# Patient Record
Sex: Male | Born: 1950
Health system: Southern US, Community
[De-identification: ages and names within clinical notes are randomized; demographics above are authoritative.]

## PROBLEM LIST (undated history)

## (undated) DIAGNOSIS — F988 Other specified behavioral and emotional disorders with onset usually occurring in childhood and adolescence: Secondary | ICD-10-CM

## (undated) DIAGNOSIS — E785 Hyperlipidemia, unspecified: Secondary | ICD-10-CM

## (undated) DIAGNOSIS — F329 Major depressive disorder, single episode, unspecified: Secondary | ICD-10-CM

## (undated) DIAGNOSIS — M1990 Unspecified osteoarthritis, unspecified site: Secondary | ICD-10-CM

## (undated) DIAGNOSIS — G4730 Sleep apnea, unspecified: Secondary | ICD-10-CM

## (undated) DIAGNOSIS — IMO0002 Reserved for concepts with insufficient information to code with codable children: Secondary | ICD-10-CM

## (undated) DIAGNOSIS — F419 Anxiety disorder, unspecified: Secondary | ICD-10-CM

## (undated) DIAGNOSIS — K219 Gastro-esophageal reflux disease without esophagitis: Secondary | ICD-10-CM

## (undated) DIAGNOSIS — I2119 ST elevation (STEMI) myocardial infarction involving other coronary artery of inferior wall: Secondary | ICD-10-CM

## (undated) DIAGNOSIS — L570 Actinic keratosis: Secondary | ICD-10-CM

## (undated) DIAGNOSIS — F32A Depression, unspecified: Secondary | ICD-10-CM

## (undated) DIAGNOSIS — C4362 Malignant melanoma of left upper limb, including shoulder: Secondary | ICD-10-CM

## (undated) DIAGNOSIS — I2510 Atherosclerotic heart disease of native coronary artery without angina pectoris: Secondary | ICD-10-CM

## (undated) DIAGNOSIS — C4491 Basal cell carcinoma of skin, unspecified: Secondary | ICD-10-CM

## (undated) DIAGNOSIS — I10 Essential (primary) hypertension: Secondary | ICD-10-CM

## (undated) HISTORY — PX: INGUINAL HERNIA REPAIR: SUR1180

## (undated) HISTORY — PX: BACK SURGERY: SHX140

## (undated) HISTORY — PX: LUMBAR DISC SURGERY: SHX700

## (undated) HISTORY — PX: DENTAL SURGERY: SHX609

## (undated) HISTORY — PX: MELANOMA EXCISION: SHX5266

## (undated) HISTORY — PX: NASAL SINUS SURGERY: SHX719

## (undated) HISTORY — PX: MOHS SURGERY: SUR867

## (undated) HISTORY — PX: PROSTATE BIOPSY: SHX241

---

## 1998-10-24 ENCOUNTER — Ambulatory Visit (HOSPITAL_COMMUNITY): Admission: RE | Admit: 1998-10-24 | Discharge: 1998-10-24 | Payer: Self-pay | Admitting: Family Medicine

## 1998-10-24 ENCOUNTER — Encounter: Payer: Self-pay | Admitting: Family Medicine

## 1998-10-27 ENCOUNTER — Encounter: Payer: Self-pay | Admitting: Neurosurgery

## 1998-10-27 ENCOUNTER — Ambulatory Visit (HOSPITAL_COMMUNITY): Admission: RE | Admit: 1998-10-27 | Discharge: 1998-10-27 | Payer: Self-pay | Admitting: Neurosurgery

## 1998-11-03 ENCOUNTER — Ambulatory Visit (HOSPITAL_COMMUNITY): Admission: RE | Admit: 1998-11-03 | Discharge: 1998-11-03 | Payer: Self-pay | Admitting: Family Medicine

## 1998-11-03 ENCOUNTER — Encounter: Payer: Self-pay | Admitting: Family Medicine

## 1999-04-10 ENCOUNTER — Ambulatory Visit (HOSPITAL_COMMUNITY): Admission: RE | Admit: 1999-04-10 | Discharge: 1999-04-10 | Payer: Self-pay | Admitting: Gastroenterology

## 2001-07-27 ENCOUNTER — Encounter: Payer: Self-pay | Admitting: Neurosurgery

## 2001-07-27 ENCOUNTER — Encounter: Admission: RE | Admit: 2001-07-27 | Discharge: 2001-07-27 | Payer: Self-pay | Admitting: Neurosurgery

## 2001-08-10 ENCOUNTER — Encounter: Admission: RE | Admit: 2001-08-10 | Discharge: 2001-08-10 | Payer: Self-pay | Admitting: Neurosurgery

## 2001-08-10 ENCOUNTER — Encounter: Payer: Self-pay | Admitting: Neurosurgery

## 2001-10-27 ENCOUNTER — Encounter: Admission: RE | Admit: 2001-10-27 | Discharge: 2001-10-27 | Payer: Self-pay | Admitting: Neurosurgery

## 2001-10-27 ENCOUNTER — Encounter: Payer: Self-pay | Admitting: Neurosurgery

## 2013-10-05 ENCOUNTER — Encounter: Payer: Self-pay | Admitting: Cardiovascular Disease

## 2013-10-05 ENCOUNTER — Ambulatory Visit (INDEPENDENT_AMBULATORY_CARE_PROVIDER_SITE_OTHER): Payer: BC Managed Care – PPO | Admitting: Cardiovascular Disease

## 2013-10-05 VITALS — BP 150/80 | HR 58 | Ht 71.0 in | Wt 235.0 lb

## 2013-10-05 DIAGNOSIS — R030 Elevated blood-pressure reading, without diagnosis of hypertension: Secondary | ICD-10-CM

## 2013-10-05 DIAGNOSIS — I208 Other forms of angina pectoris: Secondary | ICD-10-CM

## 2013-10-05 DIAGNOSIS — I209 Angina pectoris, unspecified: Secondary | ICD-10-CM

## 2013-10-05 DIAGNOSIS — IMO0001 Reserved for inherently not codable concepts without codable children: Secondary | ICD-10-CM | POA: Insufficient documentation

## 2013-10-05 DIAGNOSIS — R079 Chest pain, unspecified: Secondary | ICD-10-CM

## 2013-10-05 NOTE — Assessment & Plan Note (Signed)
The patient episode of exertional palpitations, left arm and jaw discomfort is suggestive of angina equivalent. No further episodes since then but he has not exerted himself enough. Baseline ECG is slightly abnormal with 0.5 mm of ST depression in the inferior leads. I recommend further risk stratification with a treadmill nuclear stress test. A treadmill stress test alone would not be interpretable due to abnormal baseline. He is to continue aspirin daily and simvastatin. He is mildly bradycardic and thus will avoid adding a beta blocker for now.

## 2013-10-05 NOTE — Assessment & Plan Note (Signed)
No history of hypertension. Continue to monitor and consider treatment based on follow up readings.

## 2013-10-05 NOTE — Patient Instructions (Addendum)
Your physician has requested that you have an exercise stress myoview. For further information please visit [email or web address] Please follow instruction sheet, as given.  Your physician recommends that you schedule a follow-up appointment as needed with Dr Kirke Corin.   Your physician recommends that you continue on your current medications as directed. Please refer to the Current Medication list given to you today.

## 2013-10-05 NOTE — Progress Notes (Signed)
  PCP: Dr. Johny Blamer  HPI  This is a pleasant 62 year old man who was referred by Dr. Tiburcio Pea for evaluation of palpitations, arm and jaw pain. The patient has no previous cardiac history. He reports having a stress test done more than 20 years ago but none recently. He has known history of hyperlipidemia treated with simvastatin as well as chronically elevated PSA. About one month ago when he was mowing the grass, he had an episode of palpitations associated with left arm and jaw discomfort. This was associated with shortness of breath. Lasted for about 15 minutes and resolved with rest. He has not had any further episodes since that time. However, he avoided doing significant physical activities since that episode. He has noticed gradually increasing shortness of breath over the last year. No orthopnea, PND or lower extremity edema. He has no family history of premature coronary artery disease. He quit smoking more than 25 years ago. Denies any alcohol use. He works in [employer].  No Known Allergies   No current outpatient prescriptions on file prior to visit.   No current facility-administered medications on file prior to visit.     No past medical history on file.   No past surgical history on file.   No family history on file.   History   Social History  . Marital Status: Married    Spouse Name: N/A    Number of Children: N/A  . Years of Education: N/A   Occupational History  . Not on file.   Social History Main Topics  . Smoking status: Former Games developer  . Smokeless tobacco: Not on file  . Alcohol Use: No  . Drug Use: No  . Sexual Activity: Not on file   Other Topics Concern  . Not on file   Social History Narrative  . No narrative on file     ROS: A 10 point review of system was performed. It is negative other than what is mentioned in the history of present illness.  PHYSICAL EXAM   BP 150/80  Pulse 58  Ht 5\' 11"  (1.803 m)  Wt 235 lb (106.595 kg)   BMI 32.79 kg/m2 Constitutional: He is oriented to person, place, and time. He appears well-developed and well-nourished. No distress.  HENT: No nasal discharge.  Head: Normocephalic and atraumatic.  Eyes: Pupils are equal and round. Right eye exhibits no discharge. Left eye exhibits no discharge.  Neck: Normal range of motion. Neck supple. No JVD present. No thyromegaly present.  Cardiovascular: Normal rate, regular rhythm, normal heart sounds and. Exam reveals no gallop and no friction rub. No murmur heard.  Pulmonary/Chest: Effort normal and breath sounds normal. No stridor. No respiratory distress. He has no wheezes. He has no rales. He exhibits no tenderness.  Abdominal: Soft. Bowel sounds are normal. He exhibits no distension. There is no tenderness. There is no rebound and no guarding.  Musculoskeletal: Normal range of motion. He exhibits no edema and no tenderness.  Neurological: He is alert and oriented to person, place, and time. Coordination normal.  Skin: Skin is warm and dry. No rash noted. He is not diaphoretic. No erythema. No pallor.  Psychiatric: He has a normal mood and affect. His behavior is normal. Judgment and thought content normal.     EKG: Normal sinus rhythm with 0.5 mm of ST depression in the inferior leads.   ASSESSMENT AND PLAN

## 2013-10-26 ENCOUNTER — Ambulatory Visit (HOSPITAL_COMMUNITY): Payer: BC Managed Care – PPO | Attending: Cardiology | Admitting: Radiology

## 2013-10-26 VITALS — BP 152/81 | HR 61 | Ht 71.0 in | Wt 234.0 lb

## 2013-10-26 DIAGNOSIS — R0989 Other specified symptoms and signs involving the circulatory and respiratory systems: Secondary | ICD-10-CM | POA: Insufficient documentation

## 2013-10-26 DIAGNOSIS — R9431 Abnormal electrocardiogram [ECG] [EKG]: Secondary | ICD-10-CM | POA: Insufficient documentation

## 2013-10-26 DIAGNOSIS — R079 Chest pain, unspecified: Secondary | ICD-10-CM

## 2013-10-26 DIAGNOSIS — R6884 Jaw pain: Secondary | ICD-10-CM | POA: Insufficient documentation

## 2013-10-26 DIAGNOSIS — R0602 Shortness of breath: Secondary | ICD-10-CM

## 2013-10-26 DIAGNOSIS — R002 Palpitations: Secondary | ICD-10-CM | POA: Insufficient documentation

## 2013-10-26 DIAGNOSIS — R0609 Other forms of dyspnea: Secondary | ICD-10-CM | POA: Insufficient documentation

## 2013-10-26 DIAGNOSIS — Z87891 Personal history of nicotine dependence: Secondary | ICD-10-CM | POA: Insufficient documentation

## 2013-10-26 DIAGNOSIS — Z8249 Family history of ischemic heart disease and other diseases of the circulatory system: Secondary | ICD-10-CM | POA: Insufficient documentation

## 2013-10-26 DIAGNOSIS — E785 Hyperlipidemia, unspecified: Secondary | ICD-10-CM | POA: Insufficient documentation

## 2013-10-26 DIAGNOSIS — M79609 Pain in unspecified limb: Secondary | ICD-10-CM | POA: Insufficient documentation

## 2013-10-26 MED ORDER — TECHNETIUM TC 99M SESTAMIBI GENERIC - CARDIOLITE
30.0000 | Freq: Once | INTRAVENOUS | Status: AC | PRN
  Administered 2013-10-26: 30 via INTRAVENOUS

## 2013-10-26 MED ORDER — TECHNETIUM TC 99M SESTAMIBI GENERIC - CARDIOLITE
10.0000 | Freq: Once | INTRAVENOUS | Status: AC | PRN
  Administered 2013-10-26: 10 via INTRAVENOUS

## 2013-10-26 NOTE — Progress Notes (Signed)
[facility] SITE 3 NUCLEAR MED [address] [place] [address] [phone]    Cardiology Nuclear Med Study  Kenneth Henderson is a 62 y.o. male     MRN : [id number]     DOB: 1951-02-23  Procedure Date: 10/26/2013  Nuclear Med Background Indication for Stress Test:  Evaluation for Ischemia and Abnormal EKG History:  GXT 20 years ago Cardiac Risk Factors: Family History - CAD, History of Smoking and Lipids  Symptoms:  DOE, Palpitations and left arm and jaw pain on exertion   Nuclear Pre-Procedure Caffeine/Decaff Intake:  None > 12 hrs NPO After: 7:00pm   Lungs:  clear O2 Sat: 94% on room air. IV 0.9% NS with Angio Cath:  20g  IV Site: R Antecubital x 1, tolerated well IV Started by:  Irean Hong, RN  Chest Size (in):  46 Cup Size: n/a  Height: 5\' 11"  (1.803 m)  Weight:  234 lb (106.142 kg)  BMI:  Body mass index is 32.65 kg/(m^2). Tech Comments:  N/A    Nuclear Med Study 1 or 2 day study: 1 day  Stress Test Type:  Stress  Reading MD: Charlton Haws, MD  Order Authorizing Provider:  Lorine Bears, MD  Resting Radionuclide: Technetium 71m Sestamibi  Resting Radionuclide Dose: 11.0 mCi   Stress Radionuclide:  Technetium 69m Sestamibi  Stress Radionuclide Dose: 33.0 mCi           Stress Protocol Rest HR: 61 Stress HR: 144  Rest BP: 152/81 Stress BP: 218/67  Exercise Time (min): 5:37 METS: 7.0   Predicted Max HR: 156 bpm % Max HR: 92.31 bpm Rate Pressure Product: 21308   Dose of Adenosine (mg):  n/a Dose of Lexiscan: n/a mg  Dose of Atropine (mg): n/a Dose of Dobutamine: n/a mcg/kg/min (at max HR)  Stress Test Technologist: Nelson Chimes, BS-ES  Nuclear Technologist:  Harlow Asa, CNMT     Rest Procedure:  Myocardial perfusion imaging was performed at rest 45 minutes following the intravenous administration of Technetium 24m Sestamibi. Rest ECG: NSR-LVH  Stress Procedure:  The patient exercised on the treadmill utilizing the Bruce Protocol for 5:37  minutes. The patient stopped due to SOB, leg fatigue and denied any chest pain.  Technetium 57m Sestamibi was injected at peak exercise and myocardial perfusion imaging was performed after a brief delay. Just into recovery, patient complained of right sided jaw pain that began to subside approximately 5 minutes into recovery.  Stress ECG: Inferior ST segment depression  QPS Raw Data Images:  Patient motion noted. Stress Images:  Normal homogeneous uptake in all areas of the myocardium. Rest Images:  Normal homogeneous uptake in all areas of the myocardium. Subtraction (SDS):  Normal Transient Ischemic Dilatation (Normal <1.22):  0.99 Lung/Heart Ratio (Normal <0.45):  0.35  Quantitative Gated Spect Images QGS EDV:  132 ml QGS ESV:  63 ml  Impression Exercise Capacity:  Poor exercise capacity. BP Response:  Hypertensive blood pressure response. Clinical Symptoms:  There is dyspnea. ECG Impression:  LVH with inferior ST segment depression Comparison with Prior Nuclear Study: No images to compare  Overall Impression:  Low risk stress nuclear study Normal nuclear images.  Baseline ECG with LVH  Inferior St segment depression with exercise.  LV Ejection Fraction: 52%.  LV Wall Motion:  NL LV Function; NL Wall Motion  Charlton Haws

## 2014-04-01 ENCOUNTER — Ambulatory Visit
Admission: RE | Admit: 2014-04-01 | Discharge: 2014-04-01 | Disposition: A | Discharge status: Home / Self Care | Payer: BC Managed Care – PPO | Source: Ambulatory Visit | Attending: Family Medicine | Admitting: Family Medicine

## 2014-04-01 ENCOUNTER — Other Ambulatory Visit: Payer: Self-pay | Admitting: Family Medicine

## 2014-04-01 DIAGNOSIS — M79609 Pain in unspecified limb: Secondary | ICD-10-CM

## 2014-06-22 DIAGNOSIS — I2119 ST elevation (STEMI) myocardial infarction involving other coronary artery of inferior wall: Secondary | ICD-10-CM

## 2014-06-22 HISTORY — PX: CORONARY ANGIOPLASTY WITH STENT PLACEMENT: SHX49

## 2014-06-22 HISTORY — DX: ST elevation (STEMI) myocardial infarction involving other coronary artery of inferior wall: I21.19

## 2014-08-11 ENCOUNTER — Encounter (HOSPITAL_COMMUNITY): Payer: Self-pay

## 2014-08-11 ENCOUNTER — Ambulatory Visit (HOSPITAL_COMMUNITY)
Admission: RE | Admit: 2014-08-11 | Discharge: 2014-08-11 | Disposition: A | Discharge status: Home / Self Care | Payer: BC Managed Care – PPO | Source: Ambulatory Visit | Attending: Internal Medicine | Admitting: Internal Medicine

## 2014-08-11 VITALS — BP 120/64 | HR 56 | Ht 71.0 in | Wt 237.0 lb

## 2014-08-11 DIAGNOSIS — Z7982 Long term (current) use of aspirin: Secondary | ICD-10-CM | POA: Diagnosis not present

## 2014-08-11 DIAGNOSIS — I209 Angina pectoris, unspecified: Secondary | ICD-10-CM | POA: Diagnosis not present

## 2014-08-11 DIAGNOSIS — I2589 Other forms of chronic ischemic heart disease: Secondary | ICD-10-CM | POA: Insufficient documentation

## 2014-08-11 DIAGNOSIS — Z87891 Personal history of nicotine dependence: Secondary | ICD-10-CM | POA: Diagnosis not present

## 2014-08-11 DIAGNOSIS — E291 Testicular hypofunction: Secondary | ICD-10-CM | POA: Diagnosis not present

## 2014-08-11 DIAGNOSIS — E785 Hyperlipidemia, unspecified: Secondary | ICD-10-CM | POA: Diagnosis not present

## 2014-08-11 DIAGNOSIS — I2510 Atherosclerotic heart disease of native coronary artery without angina pectoris: Secondary | ICD-10-CM | POA: Diagnosis not present

## 2014-08-11 DIAGNOSIS — I208 Other forms of angina pectoris: Secondary | ICD-10-CM

## 2014-08-11 DIAGNOSIS — I255 Ischemic cardiomyopathy: Secondary | ICD-10-CM

## 2014-08-11 DIAGNOSIS — M47812 Spondylosis without myelopathy or radiculopathy, cervical region: Secondary | ICD-10-CM | POA: Diagnosis not present

## 2014-08-11 NOTE — Patient Instructions (Signed)
Labs in 1 month  Your physician has requested that you have an echocardiogram. Echocardiography is a painless test that uses sound waves to create images of your heart. It provides your doctor with information about the size and shape of your heart and how well your heart's chambers and valves are working. This procedure takes approximately one hour. There are no restrictions for this procedure.  You have been referred to Cardiac Rehab  Your physician recommends that you schedule a follow-up appointment in: 3 month

## 2014-08-12 DIAGNOSIS — I255 Ischemic cardiomyopathy: Secondary | ICD-10-CM | POA: Insufficient documentation

## 2014-08-12 DIAGNOSIS — I2510 Atherosclerotic heart disease of native coronary artery without angina pectoris: Secondary | ICD-10-CM | POA: Insufficient documentation

## 2014-08-12 NOTE — Progress Notes (Signed)
Patient ID: Kenneth Henderson, male   DOB: 10/12/1951, 63 y.o.   MRN: [id number] PCP: Dr. Kenton Kingfisher  63 yo with recent inferior MI presents to establish cardiology care.  Patient was at [name] the grass in 7/15.  He developed severe dyspnea and chest pain that did not resolve and called EMS. He was taken to the hospital in [place] and found to be having an inferior MI.  He went to the cath lab and had a DES placed in the RCA.  EF was 40-45% by LV-gram.    Since leaving the hospital, he has been doing well.  He is back at work Artist.  He has no exertional dyspnea or chest pain.  He is tolerating his medications without problems.  No lightheadedness or syncope.    ECG: NSR, old inferior MI  PMH:  1. C-spine arthritis 2. Low testosterone 3. Hyperlipidemia 4. CAD: ETT-Cardiolite 10/14 with below normal exercise capacity but EF 52% and no perfusion defects.  Inferior MI in 7/15 with totally occluded mid RCA, EF 40-45%, inferior akinesis.  Patient had DES to RCA [facility]).    SH: Married, prior smoker, Administrator property  FH: No premature CAD  ROS: All systems reviewed and negative except as per HPI.   Current Outpatient Prescriptions  Medication Sig Dispense Refill  . aspirin 81 MG tablet Take 81 mg by mouth daily.      Marland Kitchen atorvastatin (LIPITOR) 80 MG tablet Take 80 mg by mouth daily.      Hinda Kehr 30 MG/ACT SOLN Take 1 tablet by mouth daily.      Marland Kitchen buPROPion (WELLBUTRIN XL) 300 MG 24 hr tablet Take 1 tablet by mouth daily.      . citalopram (CELEXA) 40 MG tablet Take by mouth daily.       . clopidogrel (PLAVIX) 75 MG tablet Take 75 mg by mouth daily.      Marland Kitchen esomeprazole (NEXIUM) 40 MG capsule Take 40 mg by mouth daily at 12 noon.      Marland Kitchen GLUCOSAMINE-CHONDROITIN PO Take 1 tablet by mouth daily.      Marland Kitchen lisinopril (PRINIVIL,ZESTRIL) 2.5 MG tablet Take 2.5 mg by mouth daily.      . meloxicam (MOBIC) 15 MG tablet Take 15 mg by mouth daily.      .  metoprolol succinate (TOPROL-XL) 50 MG 24 hr tablet Take 50 mg by mouth daily. Take with or immediately following a meal.      . NITROSTAT 0.4 MG SL tablet as needed.      . Omega-3 Fatty Acids (FISH OIL) 1000 MG CAPS Take 1,000 mg by mouth daily.      . tadalafil (CIALIS) 5 MG tablet Take 5 mg by mouth daily as needed for erectile dysfunction.      . simvastatin (ZOCOR) 80 MG tablet Take 80 mg by mouth at bedtime.       No current facility-administered medications for this encounter.    BP 120/64  Pulse 56  Ht 5\' 11"  (1.803 m)  Wt 237 lb (107.502 kg)  BMI 33.07 kg/m2  SpO2 92% General: NAD Neck: No JVD, no thyromegaly or thyroid nodule.  Lungs: Clear to auscultation bilaterally with normal respiratory effort. CV: Nondisplaced PMI.  Heart regular S1/S2, no +S4, no murmur.  No peripheral edema.  No carotid bruit.  Normal pedal pulses.  Abdomen: Soft, nontender, no hepatosplenomegaly, no distention.  Skin: Intact without lesions or rashes.  Neurologic: Alert and  oriented x 3.  Psych: Normal affect. Extremities: No clubbing or cyanosis.  HEENT: Normal.   Assessment/Plan: 1. CAD: s/p inferior MI in 7/15 with DES to RCA.  Patient is doing well currently with no ischemic symptoms.   - Continue ASA 81 and Plavix 75 daily - Continue Toprol XL and lisinopril at current doses. - Continue atorvastatin 80 mg daily.   - I will set him up with cardiac rehab.  2. Hyperlipidemia: Check lipids in 1 month.  3. Ischemic cardiomyopathy: I will get an echocardiogram to reassess EF.  It was 40-45% on LV-gram.   Loralie Champagne 08/12/2014

## 2014-08-25 ENCOUNTER — Encounter (HOSPITAL_COMMUNITY)
Admission: RE | Admit: 2014-08-25 | Discharge: 2014-08-25 | Disposition: A | Discharge status: Home / Self Care | Payer: Medicare Other | Source: Ambulatory Visit | Attending: Internal Medicine | Admitting: Internal Medicine

## 2014-08-25 DIAGNOSIS — I2510 Atherosclerotic heart disease of native coronary artery without angina pectoris: Secondary | ICD-10-CM | POA: Insufficient documentation

## 2014-08-25 DIAGNOSIS — I209 Angina pectoris, unspecified: Secondary | ICD-10-CM | POA: Insufficient documentation

## 2014-08-25 DIAGNOSIS — I2589 Other forms of chronic ischemic heart disease: Secondary | ICD-10-CM | POA: Insufficient documentation

## 2014-08-25 NOTE — Progress Notes (Signed)
Cardiac Rehab Medication Review by a Pharmacist  Does the patient  feel that his/her medications are working for him/her?  yes  Has the patient been experiencing any side effects to the medications prescribed?  Yes. Dizziness  Does the patient measure his/her own blood pressure or blood glucose at home?  no   Does the patient have any problems obtaining medications due to transportation or finances?   no  Understanding of regimen: good Understanding of indications: good Potential of compliance: good  Pharmacist comments: Pt reports compliance with his medication. Asked if he could take all of his daily medications at one time so that it was easier to keep track of them. I told him that would be fine as atorvastatin can be taken in the am with similar effect to nighttime dosing. Pt does not have any other complaints.  Reginia Naas 08/25/2014 9:29 AM

## 2014-08-31 ENCOUNTER — Encounter (HOSPITAL_COMMUNITY)
Admission: RE | Admit: 2014-08-31 | Discharge: 2014-08-31 | Disposition: A | Discharge status: Home / Self Care | Payer: Medicare Other | Source: Ambulatory Visit | Attending: Internal Medicine | Admitting: Internal Medicine

## 2014-08-31 DIAGNOSIS — I2589 Other forms of chronic ischemic heart disease: Secondary | ICD-10-CM | POA: Diagnosis not present

## 2014-08-31 DIAGNOSIS — I2510 Atherosclerotic heart disease of native coronary artery without angina pectoris: Secondary | ICD-10-CM | POA: Diagnosis not present

## 2014-08-31 DIAGNOSIS — I209 Angina pectoris, unspecified: Secondary | ICD-10-CM | POA: Diagnosis not present

## 2014-08-31 NOTE — Progress Notes (Signed)
Pt started cardiac rehab today.  Pt tolerated light exercise without difficulty. Telemetry rhythm Sinus. Vital signs stable. Will continue to monitor the patient throughout  the program.  

## 2014-08-31 NOTE — Progress Notes (Addendum)
Pt reported for first day of exercise.  Pt was visibily upset and angry about heart condition.  Pt was unable to establish any personal goals stating that he was only hear because his wife is making him.  Pt was easily angered when staff would approach to try to perform blood pressure and assess workloads.  Pt stated that he was angry with his healthcare providers and associated cost over his heart condition.  He also expressed that his kids were upset to now have to note a family history of cardiac disease.  Pt had a good exercise session but still left with an angry disposition.  Pt also noted that he did not have the time to come to exercise or to drive here each day.  Other staff members and Surveyor, quantity also noticed pt's upset disposition. Alberteen Sam, MA, ACSM RCEP

## 2014-09-02 ENCOUNTER — Encounter (HOSPITAL_COMMUNITY)
Admission: RE | Admit: 2014-09-02 | Discharge: 2014-09-02 | Disposition: A | Discharge status: Home / Self Care | Payer: Medicare Other | Source: Ambulatory Visit | Attending: Internal Medicine | Admitting: Internal Medicine

## 2014-09-02 DIAGNOSIS — I2510 Atherosclerotic heart disease of native coronary artery without angina pectoris: Secondary | ICD-10-CM | POA: Diagnosis not present

## 2014-09-05 ENCOUNTER — Encounter (HOSPITAL_COMMUNITY)
Admission: RE | Admit: 2014-09-05 | Discharge: 2014-09-05 | Disposition: A | Discharge status: Home / Self Care | Payer: Medicare Other | Source: Ambulatory Visit | Attending: Internal Medicine | Admitting: Internal Medicine

## 2014-09-05 DIAGNOSIS — L678 Other hair color and hair shaft abnormalities: Secondary | ICD-10-CM | POA: Diagnosis not present

## 2014-09-05 DIAGNOSIS — I2510 Atherosclerotic heart disease of native coronary artery without angina pectoris: Secondary | ICD-10-CM | POA: Diagnosis not present

## 2014-09-05 DIAGNOSIS — F411 Generalized anxiety disorder: Secondary | ICD-10-CM | POA: Diagnosis not present

## 2014-09-05 DIAGNOSIS — L738 Other specified follicular disorders: Secondary | ICD-10-CM | POA: Diagnosis not present

## 2014-09-06 ENCOUNTER — Ambulatory Visit (HOSPITAL_BASED_OUTPATIENT_CLINIC_OR_DEPARTMENT_OTHER)
Admission: RE | Admit: 2014-09-06 | Discharge: 2014-09-06 | Disposition: A | Discharge status: Home / Self Care | Payer: Medicare Other | Source: Ambulatory Visit | Attending: Cardiology | Admitting: Cardiology

## 2014-09-06 ENCOUNTER — Ambulatory Visit (HOSPITAL_COMMUNITY)
Admission: RE | Admit: 2014-09-06 | Discharge: 2014-09-06 | Disposition: A | Discharge status: Home / Self Care | Payer: Medicare Other | Source: Ambulatory Visit | Attending: Cardiology | Admitting: Cardiology

## 2014-09-06 DIAGNOSIS — I208 Other forms of angina pectoris: Secondary | ICD-10-CM

## 2014-09-06 DIAGNOSIS — I359 Nonrheumatic aortic valve disorder, unspecified: Secondary | ICD-10-CM

## 2014-09-06 DIAGNOSIS — I219 Acute myocardial infarction, unspecified: Secondary | ICD-10-CM | POA: Insufficient documentation

## 2014-09-06 DIAGNOSIS — I2589 Other forms of chronic ischemic heart disease: Secondary | ICD-10-CM | POA: Insufficient documentation

## 2014-09-06 DIAGNOSIS — I209 Angina pectoris, unspecified: Secondary | ICD-10-CM | POA: Diagnosis not present

## 2014-09-06 DIAGNOSIS — I5022 Chronic systolic (congestive) heart failure: Secondary | ICD-10-CM

## 2014-09-06 DIAGNOSIS — I2510 Atherosclerotic heart disease of native coronary artery without angina pectoris: Secondary | ICD-10-CM

## 2014-09-06 LAB — COMPREHENSIVE METABOLIC PANEL
ALT: 21 U/L (ref 0–53)
AST: 17 U/L (ref 0–37)
Albumin: 4 g/dL (ref 3.5–5.2)
Alkaline Phosphatase: 79 U/L (ref 39–117)
Anion gap: 11 (ref 5–15)
BUN: 20 mg/dL (ref 6–23)
CO2: 26 mEq/L (ref 19–32)
Calcium: 9.8 mg/dL (ref 8.4–10.5)
Chloride: 103 mEq/L (ref 96–112)
Creatinine, Ser: 0.94 mg/dL (ref 0.50–1.35)
GFR calc Af Amer: >90 mL/min (ref >90)
GFR calc non Af Amer: 86 mL/min — ABNORMAL LOW (ref >90)
Glucose, Bld: 98 mg/dL (ref 70–99)
Potassium: 4.2 mEq/L (ref 3.7–5.3)
Sodium: 140 mEq/L (ref 137–147)
Total Bilirubin: 0.5 mg/dL (ref 0.3–1.2)
Total Protein: 7.1 g/dL (ref 6.0–8.3)

## 2014-09-06 LAB — LIPID PANEL
Cholesterol: 128 mg/dL (ref 0–200)
HDL: 31 mg/dL — ABNORMAL LOW (ref >39)
LDL Cholesterol: 78 mg/dL (ref 0–99)
Total CHOL/HDL Ratio: 4.1 RATIO
Triglycerides: 97 mg/dL (ref <150)
VLDL: 19 mg/dL (ref 0–40)

## 2014-09-06 NOTE — Progress Notes (Signed)
Echocardiogram 2D Echocardiogram has been performed.  Kenneth Henderson 09/06/2014, 9:20 AM

## 2014-09-07 ENCOUNTER — Telehealth (HOSPITAL_COMMUNITY): Payer: Self-pay | Admitting: Family Medicine

## 2014-09-07 ENCOUNTER — Encounter (HOSPITAL_COMMUNITY): Payer: Medicare Other

## 2014-09-08 ENCOUNTER — Telehealth (HOSPITAL_COMMUNITY): Payer: Self-pay | Admitting: *Deleted

## 2014-09-09 ENCOUNTER — Encounter (HOSPITAL_COMMUNITY): Payer: Self-pay | Admitting: *Deleted

## 2014-09-09 ENCOUNTER — Telehealth (HOSPITAL_COMMUNITY): Payer: Self-pay | Admitting: *Deleted

## 2014-09-09 ENCOUNTER — Encounter (HOSPITAL_COMMUNITY): Payer: Medicare Other

## 2014-09-09 NOTE — Telephone Encounter (Signed)
Message copied by Scarlette Calico on Fri Sep 09, 2014  1:28 PM ------      Message from: Katrine Coho      Created: Fri Sep 09, 2014  7:12 AM                   ----- Message -----         From: Larey Dresser, MD         Sent: 09/09/2014  12:40 AM           To: Katrine Coho, RN            Normal LV systolic function with mild MR. ------

## 2014-09-09 NOTE — Telephone Encounter (Signed)
Pt aware.

## 2014-09-12 ENCOUNTER — Encounter (HOSPITAL_COMMUNITY): Payer: Medicare Other

## 2014-09-14 ENCOUNTER — Encounter (HOSPITAL_COMMUNITY): Payer: Medicare Other

## 2014-09-16 ENCOUNTER — Encounter (HOSPITAL_COMMUNITY): Payer: Medicare Other

## 2014-09-19 ENCOUNTER — Encounter (HOSPITAL_COMMUNITY): Payer: Medicare Other

## 2014-09-21 ENCOUNTER — Encounter (HOSPITAL_COMMUNITY): Payer: Medicare Other

## 2014-09-23 ENCOUNTER — Encounter (HOSPITAL_COMMUNITY): Payer: BC Managed Care – PPO

## 2014-09-26 ENCOUNTER — Encounter (HOSPITAL_COMMUNITY): Payer: BC Managed Care – PPO

## 2014-09-28 ENCOUNTER — Encounter (HOSPITAL_COMMUNITY): Payer: BC Managed Care – PPO

## 2014-09-30 ENCOUNTER — Encounter (HOSPITAL_COMMUNITY): Payer: BC Managed Care – PPO

## 2014-10-03 ENCOUNTER — Encounter (HOSPITAL_COMMUNITY): Payer: BC Managed Care – PPO

## 2014-10-05 ENCOUNTER — Encounter (HOSPITAL_COMMUNITY): Payer: BC Managed Care – PPO

## 2014-10-07 ENCOUNTER — Encounter (HOSPITAL_COMMUNITY): Payer: BC Managed Care – PPO

## 2014-10-10 ENCOUNTER — Encounter (HOSPITAL_COMMUNITY): Payer: BC Managed Care – PPO

## 2014-10-11 DIAGNOSIS — F39 Unspecified mood [affective] disorder: Secondary | ICD-10-CM | POA: Diagnosis not present

## 2014-10-11 DIAGNOSIS — F900 Attention-deficit hyperactivity disorder, predominantly inattentive type: Secondary | ICD-10-CM | POA: Diagnosis not present

## 2014-10-12 ENCOUNTER — Encounter (HOSPITAL_COMMUNITY): Payer: BC Managed Care – PPO

## 2014-10-14 ENCOUNTER — Encounter (HOSPITAL_COMMUNITY): Payer: BC Managed Care – PPO

## 2014-10-17 ENCOUNTER — Encounter (HOSPITAL_COMMUNITY): Payer: BC Managed Care – PPO

## 2014-10-19 ENCOUNTER — Encounter (HOSPITAL_COMMUNITY): Payer: BC Managed Care – PPO

## 2014-10-21 ENCOUNTER — Encounter (HOSPITAL_COMMUNITY): Payer: BC Managed Care – PPO

## 2014-10-24 ENCOUNTER — Encounter (HOSPITAL_COMMUNITY): Payer: BC Managed Care – PPO

## 2014-10-26 ENCOUNTER — Encounter (HOSPITAL_COMMUNITY): Payer: BC Managed Care – PPO

## 2014-10-28 ENCOUNTER — Encounter (HOSPITAL_COMMUNITY): Payer: BC Managed Care – PPO

## 2014-10-31 ENCOUNTER — Encounter (HOSPITAL_COMMUNITY): Payer: BC Managed Care – PPO

## 2014-11-02 ENCOUNTER — Encounter (HOSPITAL_COMMUNITY): Payer: BC Managed Care – PPO

## 2014-11-04 ENCOUNTER — Encounter (HOSPITAL_COMMUNITY): Payer: BC Managed Care – PPO

## 2014-11-07 ENCOUNTER — Encounter (HOSPITAL_COMMUNITY): Payer: BC Managed Care – PPO

## 2014-11-09 ENCOUNTER — Ambulatory Visit (HOSPITAL_COMMUNITY)
Admission: RE | Admit: 2014-11-09 | Discharge: 2014-11-09 | Disposition: A | Discharge status: Home / Self Care | Payer: Medicare Other | Source: Ambulatory Visit | Attending: Internal Medicine | Admitting: Internal Medicine

## 2014-11-09 ENCOUNTER — Encounter (HOSPITAL_COMMUNITY): Payer: Self-pay

## 2014-11-09 ENCOUNTER — Encounter (HOSPITAL_COMMUNITY): Payer: BC Managed Care – PPO

## 2014-11-09 VITALS — BP 118/64 | HR 51 | Resp 18 | Wt 240.0 lb

## 2014-11-09 DIAGNOSIS — Z7982 Long term (current) use of aspirin: Secondary | ICD-10-CM | POA: Insufficient documentation

## 2014-11-09 DIAGNOSIS — F419 Anxiety disorder, unspecified: Secondary | ICD-10-CM | POA: Diagnosis not present

## 2014-11-09 DIAGNOSIS — F329 Major depressive disorder, single episode, unspecified: Secondary | ICD-10-CM | POA: Diagnosis not present

## 2014-11-09 DIAGNOSIS — Z79899 Other long term (current) drug therapy: Secondary | ICD-10-CM | POA: Insufficient documentation

## 2014-11-09 DIAGNOSIS — I2510 Atherosclerotic heart disease of native coronary artery without angina pectoris: Secondary | ICD-10-CM | POA: Insufficient documentation

## 2014-11-09 DIAGNOSIS — I252 Old myocardial infarction: Secondary | ICD-10-CM | POA: Insufficient documentation

## 2014-11-09 DIAGNOSIS — E785 Hyperlipidemia, unspecified: Secondary | ICD-10-CM | POA: Insufficient documentation

## 2014-11-09 DIAGNOSIS — E780 Pure hypercholesterolemia: Secondary | ICD-10-CM | POA: Diagnosis not present

## 2014-11-09 DIAGNOSIS — I10 Essential (primary) hypertension: Secondary | ICD-10-CM | POA: Diagnosis not present

## 2014-11-09 DIAGNOSIS — Z87891 Personal history of nicotine dependence: Secondary | ICD-10-CM | POA: Diagnosis not present

## 2014-11-09 DIAGNOSIS — E291 Testicular hypofunction: Secondary | ICD-10-CM | POA: Diagnosis not present

## 2014-11-09 DIAGNOSIS — L739 Follicular disorder, unspecified: Secondary | ICD-10-CM | POA: Diagnosis not present

## 2014-11-09 DIAGNOSIS — M79672 Pain in left foot: Secondary | ICD-10-CM | POA: Diagnosis not present

## 2014-11-09 NOTE — Progress Notes (Signed)
Patient ID: Kenneth Henderson, male   DOB: [birth year], 63 y.o.   MRN: [id number] PCP: Dr. Kenton Kingfisher  63 yo with CAD presents for followup.  Patient was at his [place] in [place] the grass in 7/15.  He developed severe dyspnea and chest pain that did not resolve and called EMS. He was taken to the hospital in [place] and found to be having an inferior MI.  He went to the cath lab and had a DES placed in the RCA.  EF was 40-45% by LV-gram.  Echo in 9/15 showed EF back to 60-65% with normal RV.   He has been doing well recently.  No exertional dyspnea or chest pain.  He is very active.  He manages multiple rental properties.  No lightheadedness.  No myalgias.    Labs (9/15): K 4.2, creatinine 0.94, LDL 78, HDL 31  PMH:  1. C-spine arthritis 2. Low testosterone 3. Hyperlipidemia 4. CAD: ETT-Cardiolite 10/14 with below normal exercise capacity but EF 52% and no perfusion defects.  Inferior MI in 7/15 with totally occluded mid RCA, EF 40-45%, inferior akinesis.  Patient had DES to RCA [facility]).  Echo (9/15) with EF 60-65%, grade II diastolic dysfunction, mild MR, normal RV size and systolic function.   SH: Married, prior smoker, Administrator property  FH: No premature CAD  ROS: All systems reviewed and negative except as per HPI.   Current Outpatient Prescriptions  Medication Sig Dispense Refill  . aspirin 81 MG tablet Take 81 mg by mouth daily.    Marland Kitchen atorvastatin (LIPITOR) 80 MG tablet Take 80 mg by mouth daily.    Hinda Kehr 30 MG/ACT SOLN Take 1 tablet by mouth daily.    Marland Kitchen buPROPion (WELLBUTRIN XL) 300 MG 24 hr tablet Take 1 tablet by mouth daily.    . citalopram (CELEXA) 40 MG tablet Take by mouth daily.     . clopidogrel (PLAVIX) 75 MG tablet Take 75 mg by mouth daily.    Marland Kitchen esomeprazole (NEXIUM) 40 MG capsule Take 40 mg by mouth daily at 12 noon.    Marland Kitchen GLUCOSAMINE-CHONDROITIN PO Take 1 tablet by mouth daily.    Marland Kitchen lisinopril (PRINIVIL,ZESTRIL) 2.5 MG tablet Take 2.5 mg by mouth daily.     . meloxicam (MOBIC) 15 MG tablet Take 15 mg by mouth daily.    . metoprolol succinate (TOPROL-XL) 50 MG 24 hr tablet Take 50 mg by mouth daily. Take with or immediately following a meal.    . NITROSTAT 0.4 MG SL tablet as needed.    . Omega-3 Fatty Acids (FISH OIL) 1000 MG CAPS Take 1,000 mg by mouth daily.    . tadalafil (CIALIS) 5 MG tablet Take 5 mg by mouth daily as needed for erectile dysfunction.     No current facility-administered medications for this encounter.    BP 118/64 mmHg  Pulse 51  Resp 18  Wt 240 lb (108.863 kg)  SpO2 96% General: NAD Neck: No JVD, no thyromegaly or thyroid nodule.  Lungs: Clear to auscultation bilaterally with normal respiratory effort. CV: Nondisplaced PMI.  Heart regular S1/S2, no +S4, no murmur.  No peripheral edema.  No carotid bruit.  Normal pedal pulses.  Abdomen: Soft, nontender, no hepatosplenomegaly, no distention.  Skin: Intact without lesions or rashes.  Neurologic: Alert and oriented x 3.  Psych: Normal affect. Extremities: No clubbing or cyanosis.   Assessment/Plan: 1. CAD: s/p inferior MI in 7/15 with DES to RCA.  Patient is doing well currently with  no ischemic symptoms.  Echo in 9/15 showed EF 60-65%.  - Continue ASA 81 and Plavix 75 daily.  He is not a diabetic and can probably safely stop Plavix after a year.  - Continue Toprol XL and lisinopril at current doses. - Continue atorvastatin 80 mg daily.   2. Hyperlipidemia: Good lipids in 9/15.   Loralie Champagne 11/09/2014

## 2014-11-09 NOTE — Patient Instructions (Signed)
We will contact you in 6 months to schedule your next appointment.  

## 2014-11-10 DIAGNOSIS — F900 Attention-deficit hyperactivity disorder, predominantly inattentive type: Secondary | ICD-10-CM | POA: Diagnosis not present

## 2014-11-10 DIAGNOSIS — F39 Unspecified mood [affective] disorder: Secondary | ICD-10-CM | POA: Diagnosis not present

## 2014-11-10 DIAGNOSIS — E785 Hyperlipidemia, unspecified: Secondary | ICD-10-CM | POA: Insufficient documentation

## 2014-11-11 ENCOUNTER — Encounter (HOSPITAL_COMMUNITY): Payer: BC Managed Care – PPO

## 2014-11-14 ENCOUNTER — Encounter (HOSPITAL_COMMUNITY): Payer: BC Managed Care – PPO

## 2014-11-16 ENCOUNTER — Encounter (HOSPITAL_COMMUNITY): Payer: BC Managed Care – PPO

## 2014-11-21 ENCOUNTER — Encounter (HOSPITAL_COMMUNITY): Payer: BC Managed Care – PPO

## 2014-11-23 ENCOUNTER — Encounter (HOSPITAL_COMMUNITY): Payer: BC Managed Care – PPO

## 2014-11-25 ENCOUNTER — Encounter (HOSPITAL_COMMUNITY): Payer: BC Managed Care – PPO

## 2014-11-28 ENCOUNTER — Encounter (HOSPITAL_COMMUNITY): Payer: BC Managed Care – PPO

## 2014-11-30 ENCOUNTER — Encounter (HOSPITAL_COMMUNITY): Payer: BC Managed Care – PPO

## 2014-11-30 ENCOUNTER — Ambulatory Visit (INDEPENDENT_AMBULATORY_CARE_PROVIDER_SITE_OTHER): Payer: Medicare Other | Admitting: Podiatry

## 2014-11-30 ENCOUNTER — Encounter: Payer: Self-pay | Admitting: Podiatry

## 2014-11-30 VITALS — BP 137/77 | HR 52 | Resp 18

## 2014-11-30 DIAGNOSIS — M779 Enthesopathy, unspecified: Secondary | ICD-10-CM

## 2014-11-30 DIAGNOSIS — M205X2 Other deformities of toe(s) (acquired), left foot: Secondary | ICD-10-CM | POA: Diagnosis not present

## 2014-11-30 DIAGNOSIS — I255 Ischemic cardiomyopathy: Secondary | ICD-10-CM

## 2014-11-30 DIAGNOSIS — M7740 Metatarsalgia, unspecified foot: Secondary | ICD-10-CM

## 2014-11-30 DIAGNOSIS — B353 Tinea pedis: Secondary | ICD-10-CM

## 2014-11-30 MED ORDER — NAFTIFINE HCL 2 % EX CREA: 1.0000 "application " | TOPICAL_CREAM | CUTANEOUS | Status: AC

## 2014-11-30 NOTE — Patient Instructions (Signed)

## 2014-11-30 NOTE — Progress Notes (Signed)
   Subjective:    Patient ID: Kenneth Henderson, male    DOB: [birth year], 63 y.o.   MRN: [id number]  HPI  63 year old male presents the operative complaints of left foot pain which has been ongoing for greater than one year. He states he has pain particularly in the ball the foot for which he points the second MTPJ. He states he has pain particularly with weightbearing and prolonged ambulation. He is previously seen his primary care physician where x-rays were obtained which did not reveal any fracture. He denies any history of injury or trauma to the area and he has had no swelling or increase in redness/warmth of the foot. There is no burning or tingling sensations to his foot. There've been no changes since the last x-ray was obtained. No other complaints at this time.    Review of Systems  Musculoskeletal:       Joint pain  All other systems reviewed and are negative.      Objective:   Physical Exam AAO x3, NAD DP/PT pulses palpable bilaterally, CRT less than 3 seconds Protective sensation intact with Simms Weinstein monofilament, vibratory sensation intact, Achilles tendon reflex intact Mild tenderness to palpation overlying the second MTPJ on the left foot. There is no pinpoint bony tenderness or pain with vibratory sensation overlying the digit or the metatarsals.  There is decrease in first MTPJ dorsiflexion, with some discomfort upon range of motion. There is no other areas of pinpoint bony tenderness or pain with vibratory sensation. Mild equinus present bilaterally.There is no overlying edema, erythema, increase in warmth. MMT 5/5, ROM WNL There is dry, scaly, peeling skin on the plantar aspect of bilateral feet with slight erythematous base consistent with tinea pedis (subjectivly, patient states that the areas itch at times). No open lesions. No pain with calf compression, swelling, warmth, erythema.      Assessment & Plan:  63 year old male with likely second MTPJ  capsulitis/metatarsalgia/hallux limitus. -Previous x-rays were reviewed. Patient denied new x-rays at today's appointment as of the no changes at the previous x-rays. -Treatment options were discussed including alternatives, risks, complications. -At this time discussed likely etiology of the patient's pain. Most likely result of decreased first MTPJ range of motion and second MTPJ capsulitis. Currently there is no overlying erythema, edema and there is mild discomfort. Discussed possible steroid injection however patient wishes to walk at this time. Dispensed metatarsal pads. Upon ambulation and issues with the patch he states that the pressure off the area and alleviate his symptoms. Also discussed shoe gear modifications/orthotics to help offload symptomatically areas and support his foot. -Prescribed Naftin. -Follow-up as needed. In the meantime, call the office with any questions, concerns, change in symptoms.

## 2014-12-01 ENCOUNTER — Encounter: Payer: Self-pay | Admitting: Podiatry

## 2014-12-02 ENCOUNTER — Encounter (HOSPITAL_COMMUNITY): Payer: BC Managed Care – PPO

## 2014-12-05 ENCOUNTER — Encounter (HOSPITAL_COMMUNITY): Payer: BC Managed Care – PPO

## 2014-12-07 ENCOUNTER — Encounter (HOSPITAL_COMMUNITY): Payer: BC Managed Care – PPO

## 2014-12-21 DIAGNOSIS — R972 Elevated prostate specific antigen [PSA]: Secondary | ICD-10-CM | POA: Diagnosis not present

## 2014-12-21 DIAGNOSIS — N5201 Erectile dysfunction due to arterial insufficiency: Secondary | ICD-10-CM | POA: Diagnosis not present

## 2014-12-21 DIAGNOSIS — R3912 Poor urinary stream: Secondary | ICD-10-CM | POA: Diagnosis not present

## 2014-12-21 DIAGNOSIS — N401 Enlarged prostate with lower urinary tract symptoms: Secondary | ICD-10-CM | POA: Diagnosis not present

## 2014-12-27 DIAGNOSIS — B372 Candidiasis of skin and nail: Secondary | ICD-10-CM | POA: Diagnosis not present

## 2014-12-27 DIAGNOSIS — Z85828 Personal history of other malignant neoplasm of skin: Secondary | ICD-10-CM | POA: Diagnosis not present

## 2014-12-30 DIAGNOSIS — L570 Actinic keratosis: Secondary | ICD-10-CM | POA: Diagnosis not present

## 2015-01-03 DIAGNOSIS — R1313 Dysphagia, pharyngeal phase: Secondary | ICD-10-CM | POA: Diagnosis not present

## 2015-01-03 DIAGNOSIS — J387 Other diseases of larynx: Secondary | ICD-10-CM | POA: Diagnosis not present

## 2015-01-20 DIAGNOSIS — L570 Actinic keratosis: Secondary | ICD-10-CM | POA: Diagnosis not present

## 2015-01-27 DIAGNOSIS — L570 Actinic keratosis: Secondary | ICD-10-CM | POA: Diagnosis not present

## 2015-02-16 DIAGNOSIS — L814 Other melanin hyperpigmentation: Secondary | ICD-10-CM | POA: Diagnosis not present

## 2015-02-16 DIAGNOSIS — D045 Carcinoma in situ of skin of trunk: Secondary | ICD-10-CM | POA: Diagnosis not present

## 2015-02-16 DIAGNOSIS — D225 Melanocytic nevi of trunk: Secondary | ICD-10-CM | POA: Diagnosis not present

## 2015-02-16 DIAGNOSIS — L859 Epidermal thickening, unspecified: Secondary | ICD-10-CM | POA: Diagnosis not present

## 2015-02-16 DIAGNOSIS — D485 Neoplasm of uncertain behavior of skin: Secondary | ICD-10-CM | POA: Diagnosis not present

## 2015-02-16 DIAGNOSIS — D1801 Hemangioma of skin and subcutaneous tissue: Secondary | ICD-10-CM | POA: Diagnosis not present

## 2015-02-16 DIAGNOSIS — Z85828 Personal history of other malignant neoplasm of skin: Secondary | ICD-10-CM | POA: Diagnosis not present

## 2015-02-16 DIAGNOSIS — D044 Carcinoma in situ of skin of scalp and neck: Secondary | ICD-10-CM | POA: Diagnosis not present

## 2015-02-16 DIAGNOSIS — D2272 Melanocytic nevi of left lower limb, including hip: Secondary | ICD-10-CM | POA: Diagnosis not present

## 2015-02-16 DIAGNOSIS — D2261 Melanocytic nevi of right upper limb, including shoulder: Secondary | ICD-10-CM | POA: Diagnosis not present

## 2015-02-16 DIAGNOSIS — L570 Actinic keratosis: Secondary | ICD-10-CM | POA: Diagnosis not present

## 2015-02-16 DIAGNOSIS — L821 Other seborrheic keratosis: Secondary | ICD-10-CM | POA: Diagnosis not present

## 2015-02-16 DIAGNOSIS — L820 Inflamed seborrheic keratosis: Secondary | ICD-10-CM | POA: Diagnosis not present

## 2015-02-16 DIAGNOSIS — D2271 Melanocytic nevi of right lower limb, including hip: Secondary | ICD-10-CM | POA: Diagnosis not present

## 2015-02-17 DIAGNOSIS — L570 Actinic keratosis: Secondary | ICD-10-CM | POA: Diagnosis not present

## 2015-03-01 ENCOUNTER — Ambulatory Visit: Payer: Medicare Other | Admitting: Podiatry

## 2015-04-04 DIAGNOSIS — F900 Attention-deficit hyperactivity disorder, predominantly inattentive type: Secondary | ICD-10-CM | POA: Diagnosis not present

## 2015-04-04 DIAGNOSIS — F39 Unspecified mood [affective] disorder: Secondary | ICD-10-CM | POA: Diagnosis not present

## 2015-05-10 DIAGNOSIS — I10 Essential (primary) hypertension: Secondary | ICD-10-CM | POA: Diagnosis not present

## 2015-05-10 DIAGNOSIS — F329 Major depressive disorder, single episode, unspecified: Secondary | ICD-10-CM | POA: Diagnosis not present

## 2015-05-10 DIAGNOSIS — E780 Pure hypercholesterolemia: Secondary | ICD-10-CM | POA: Diagnosis not present

## 2015-05-10 DIAGNOSIS — E291 Testicular hypofunction: Secondary | ICD-10-CM | POA: Diagnosis not present

## 2015-07-03 DIAGNOSIS — F900 Attention-deficit hyperactivity disorder, predominantly inattentive type: Secondary | ICD-10-CM | POA: Diagnosis not present

## 2015-07-03 DIAGNOSIS — F39 Unspecified mood [affective] disorder: Secondary | ICD-10-CM | POA: Diagnosis not present

## 2015-07-18 DIAGNOSIS — R1030 Lower abdominal pain, unspecified: Secondary | ICD-10-CM | POA: Diagnosis not present

## 2015-08-17 DIAGNOSIS — D485 Neoplasm of uncertain behavior of skin: Secondary | ICD-10-CM | POA: Diagnosis not present

## 2015-08-17 DIAGNOSIS — C44212 Basal cell carcinoma of skin of right ear and external auricular canal: Secondary | ICD-10-CM | POA: Diagnosis not present

## 2015-08-17 DIAGNOSIS — D225 Melanocytic nevi of trunk: Secondary | ICD-10-CM | POA: Diagnosis not present

## 2015-08-17 DIAGNOSIS — L814 Other melanin hyperpigmentation: Secondary | ICD-10-CM | POA: Diagnosis not present

## 2015-08-17 DIAGNOSIS — R1030 Lower abdominal pain, unspecified: Secondary | ICD-10-CM | POA: Diagnosis not present

## 2015-08-17 DIAGNOSIS — C44222 Squamous cell carcinoma of skin of right ear and external auricular canal: Secondary | ICD-10-CM | POA: Diagnosis not present

## 2015-08-17 DIAGNOSIS — Z85828 Personal history of other malignant neoplasm of skin: Secondary | ICD-10-CM | POA: Diagnosis not present

## 2015-08-17 DIAGNOSIS — L821 Other seborrheic keratosis: Secondary | ICD-10-CM | POA: Diagnosis not present

## 2015-08-17 DIAGNOSIS — L570 Actinic keratosis: Secondary | ICD-10-CM | POA: Diagnosis not present

## 2015-08-17 DIAGNOSIS — C44612 Basal cell carcinoma of skin of right upper limb, including shoulder: Secondary | ICD-10-CM | POA: Diagnosis not present

## 2015-08-17 DIAGNOSIS — C44629 Squamous cell carcinoma of skin of left upper limb, including shoulder: Secondary | ICD-10-CM | POA: Diagnosis not present

## 2015-08-17 DIAGNOSIS — D1801 Hemangioma of skin and subcutaneous tissue: Secondary | ICD-10-CM | POA: Diagnosis not present

## 2015-09-07 DIAGNOSIS — C44222 Squamous cell carcinoma of skin of right ear and external auricular canal: Secondary | ICD-10-CM | POA: Diagnosis not present

## 2015-09-07 DIAGNOSIS — C44212 Basal cell carcinoma of skin of right ear and external auricular canal: Secondary | ICD-10-CM | POA: Diagnosis not present

## 2015-09-07 DIAGNOSIS — Z85828 Personal history of other malignant neoplasm of skin: Secondary | ICD-10-CM | POA: Diagnosis not present

## 2015-09-19 ENCOUNTER — Other Ambulatory Visit: Payer: Self-pay | Admitting: Family Medicine

## 2015-09-19 ENCOUNTER — Ambulatory Visit
Admission: RE | Admit: 2015-09-19 | Discharge: 2015-09-19 | Disposition: A | Discharge status: Home / Self Care | Payer: Medicare Other | Source: Ambulatory Visit | Attending: Family Medicine | Admitting: Family Medicine

## 2015-09-19 DIAGNOSIS — M25552 Pain in left hip: Secondary | ICD-10-CM | POA: Diagnosis not present

## 2015-09-19 DIAGNOSIS — M5032 Other cervical disc degeneration, mid-cervical region: Secondary | ICD-10-CM | POA: Diagnosis not present

## 2015-09-19 DIAGNOSIS — M542 Cervicalgia: Secondary | ICD-10-CM

## 2015-09-19 DIAGNOSIS — R1030 Lower abdominal pain, unspecified: Secondary | ICD-10-CM

## 2015-09-19 DIAGNOSIS — M1611 Unilateral primary osteoarthritis, right hip: Secondary | ICD-10-CM | POA: Diagnosis not present

## 2015-09-20 DIAGNOSIS — D1722 Benign lipomatous neoplasm of skin and subcutaneous tissue of left arm: Secondary | ICD-10-CM | POA: Diagnosis not present

## 2015-09-20 DIAGNOSIS — L570 Actinic keratosis: Secondary | ICD-10-CM | POA: Diagnosis not present

## 2015-09-20 DIAGNOSIS — Z85828 Personal history of other malignant neoplasm of skin: Secondary | ICD-10-CM | POA: Diagnosis not present

## 2015-09-27 DIAGNOSIS — M545 Low back pain: Secondary | ICD-10-CM | POA: Diagnosis not present

## 2015-09-27 DIAGNOSIS — M542 Cervicalgia: Secondary | ICD-10-CM | POA: Diagnosis not present

## 2015-09-27 DIAGNOSIS — G589 Mononeuropathy, unspecified: Secondary | ICD-10-CM | POA: Diagnosis not present

## 2015-09-27 DIAGNOSIS — M5442 Lumbago with sciatica, left side: Secondary | ICD-10-CM | POA: Diagnosis not present

## 2015-09-28 DIAGNOSIS — Z4802 Encounter for removal of sutures: Secondary | ICD-10-CM | POA: Diagnosis not present

## 2015-10-05 DIAGNOSIS — M50322 Other cervical disc degeneration at C5-C6 level: Secondary | ICD-10-CM | POA: Diagnosis not present

## 2015-10-05 DIAGNOSIS — M47812 Spondylosis without myelopathy or radiculopathy, cervical region: Secondary | ICD-10-CM | POA: Diagnosis not present

## 2015-10-05 DIAGNOSIS — M47816 Spondylosis without myelopathy or radiculopathy, lumbar region: Secondary | ICD-10-CM | POA: Diagnosis not present

## 2015-10-05 DIAGNOSIS — M5126 Other intervertebral disc displacement, lumbar region: Secondary | ICD-10-CM | POA: Diagnosis not present

## 2015-10-05 DIAGNOSIS — M5136 Other intervertebral disc degeneration, lumbar region: Secondary | ICD-10-CM | POA: Diagnosis not present

## 2015-10-12 DIAGNOSIS — L570 Actinic keratosis: Secondary | ICD-10-CM | POA: Diagnosis not present

## 2015-10-12 DIAGNOSIS — Z4801 Encounter for change or removal of surgical wound dressing: Secondary | ICD-10-CM | POA: Diagnosis not present

## 2015-10-13 ENCOUNTER — Telehealth (HOSPITAL_COMMUNITY): Payer: Self-pay

## 2015-10-13 NOTE — Telephone Encounter (Signed)
At this point, ok to hold Plavix for 5 days.  > 1 year post-PCI.

## 2015-10-13 NOTE — Telephone Encounter (Signed)
[place] [phone]) and left message telling him that Dr. Aundra Dubin approves for the patient to be off Plavix for 5 days with him greater than one year post PCI

## 2015-10-13 NOTE — Telephone Encounter (Signed)
Kenneth [place] [phone]) for Dr. Melrose Nakayama, would like to know if the patient can be off Plavix for 5-7 days for spinal injections.

## 2015-10-18 DIAGNOSIS — M4806 Spinal stenosis, lumbar region: Secondary | ICD-10-CM | POA: Diagnosis not present

## 2015-10-18 NOTE — Telephone Encounter (Signed)
Dr Rhona Raider would like to know if he can come off his ASA also. You have approved the Plavix.   Approved by Dr Aundra Dubin to hold both the Plavix and ASA for 5-7 days before injection.

## 2015-10-18 NOTE — Telephone Encounter (Signed)
[place] aware.

## 2015-10-26 DIAGNOSIS — M4806 Spinal stenosis, lumbar region: Secondary | ICD-10-CM | POA: Diagnosis not present

## 2015-10-26 DIAGNOSIS — M5126 Other intervertebral disc displacement, lumbar region: Secondary | ICD-10-CM | POA: Diagnosis not present

## 2015-10-26 DIAGNOSIS — M549 Dorsalgia, unspecified: Secondary | ICD-10-CM | POA: Diagnosis not present

## 2015-11-01 DIAGNOSIS — M5126 Other intervertebral disc displacement, lumbar region: Secondary | ICD-10-CM | POA: Diagnosis not present

## 2015-11-07 DIAGNOSIS — I10 Essential (primary) hypertension: Secondary | ICD-10-CM | POA: Diagnosis not present

## 2015-11-07 DIAGNOSIS — Z23 Encounter for immunization: Secondary | ICD-10-CM | POA: Diagnosis not present

## 2015-11-07 DIAGNOSIS — E7800 Pure hypercholesterolemia, unspecified: Secondary | ICD-10-CM | POA: Diagnosis not present

## 2015-11-07 DIAGNOSIS — M4806 Spinal stenosis, lumbar region: Secondary | ICD-10-CM | POA: Diagnosis not present

## 2015-11-07 DIAGNOSIS — F329 Major depressive disorder, single episode, unspecified: Secondary | ICD-10-CM | POA: Diagnosis not present

## 2015-11-29 DIAGNOSIS — M5126 Other intervertebral disc displacement, lumbar region: Secondary | ICD-10-CM | POA: Diagnosis not present

## 2015-12-22 DIAGNOSIS — M4806 Spinal stenosis, lumbar region: Secondary | ICD-10-CM | POA: Diagnosis not present

## 2015-12-22 DIAGNOSIS — M4726 Other spondylosis with radiculopathy, lumbar region: Secondary | ICD-10-CM | POA: Diagnosis not present

## 2015-12-22 DIAGNOSIS — M5431 Sciatica, right side: Secondary | ICD-10-CM | POA: Diagnosis not present

## 2015-12-22 DIAGNOSIS — M5126 Other intervertebral disc displacement, lumbar region: Secondary | ICD-10-CM | POA: Diagnosis not present

## 2015-12-24 HISTORY — PX: LUMBAR LAMINECTOMY: SHX95

## 2015-12-28 DIAGNOSIS — F39 Unspecified mood [affective] disorder: Secondary | ICD-10-CM | POA: Diagnosis not present

## 2015-12-28 DIAGNOSIS — F900 Attention-deficit hyperactivity disorder, predominantly inattentive type: Secondary | ICD-10-CM | POA: Diagnosis not present

## 2016-01-12 DIAGNOSIS — M4806 Spinal stenosis, lumbar region: Secondary | ICD-10-CM | POA: Diagnosis not present

## 2016-01-12 DIAGNOSIS — Z01812 Encounter for preprocedural laboratory examination: Secondary | ICD-10-CM | POA: Diagnosis not present

## 2016-01-12 DIAGNOSIS — Z4689 Encounter for fitting and adjustment of other specified devices: Secondary | ICD-10-CM | POA: Diagnosis not present

## 2016-01-12 DIAGNOSIS — M4726 Other spondylosis with radiculopathy, lumbar region: Secondary | ICD-10-CM | POA: Diagnosis not present

## 2016-01-15 ENCOUNTER — Encounter (HOSPITAL_COMMUNITY): Payer: Self-pay

## 2016-01-15 ENCOUNTER — Ambulatory Visit (HOSPITAL_COMMUNITY)
Admission: RE | Admit: 2016-01-15 | Discharge: 2016-01-15 | Disposition: A | Discharge status: Home / Self Care | Payer: Medicare Other | Source: Ambulatory Visit | Attending: Cardiology | Admitting: Cardiology

## 2016-01-15 VITALS — BP 134/72 | HR 61 | Wt 243.2 lb

## 2016-01-15 DIAGNOSIS — Z7982 Long term (current) use of aspirin: Secondary | ICD-10-CM | POA: Diagnosis not present

## 2016-01-15 DIAGNOSIS — E785 Hyperlipidemia, unspecified: Secondary | ICD-10-CM

## 2016-01-15 DIAGNOSIS — I2510 Atherosclerotic heart disease of native coronary artery without angina pectoris: Secondary | ICD-10-CM | POA: Diagnosis not present

## 2016-01-15 DIAGNOSIS — Z955 Presence of coronary angioplasty implant and graft: Secondary | ICD-10-CM | POA: Diagnosis not present

## 2016-01-15 DIAGNOSIS — Z79899 Other long term (current) drug therapy: Secondary | ICD-10-CM | POA: Insufficient documentation

## 2016-01-15 DIAGNOSIS — I255 Ischemic cardiomyopathy: Secondary | ICD-10-CM | POA: Diagnosis not present

## 2016-01-15 DIAGNOSIS — I252 Old myocardial infarction: Secondary | ICD-10-CM | POA: Insufficient documentation

## 2016-01-15 DIAGNOSIS — M549 Dorsalgia, unspecified: Secondary | ICD-10-CM | POA: Insufficient documentation

## 2016-01-15 DIAGNOSIS — Z87891 Personal history of nicotine dependence: Secondary | ICD-10-CM | POA: Diagnosis not present

## 2016-01-15 DIAGNOSIS — Z7902 Long term (current) use of antithrombotics/antiplatelets: Secondary | ICD-10-CM | POA: Diagnosis not present

## 2016-01-15 NOTE — Addendum Note (Signed)
Encounter addended by: Larey Dresser, MD on: 01/15/2016 10:09 AM<BR>     Documentation filed: Notes Section

## 2016-01-15 NOTE — Progress Notes (Addendum)
Patient ID: Kenneth Henderson, male   DOB: [birth year], 65 y.o.   MRN: [id number]    Advanced Heart Failure Clinic Note   PCP: Dr. Kenton Kingfisher Cardiology: Dr. Aundra Dubin  65 yo with CAD presents for followup.  Patient was at his [facility] in [place] the grass in 7/15.  He developed severe dyspnea and chest pain that did not resolve and called EMS. He was taken to the hospital in [place] and found to be having an inferior MI.  He went to the cath lab and had a DES placed in the RCA.  EF was 40-45% by LV-gram.  Echo in 9/15 showed EF back to 60-65% with normal RV.   He returns today for regular follow up and surgical clearance for laminectomy/discectomy 01/17/16. Has been off Plavix since 01/10/16. Has not had any CP or SOB.  No DOE.  Not exercising intentionally but up and down stairs and ladders frequently with no problems. 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 Remains very active. He manages multiple rental properties.  Denies lightheadedness or dizziness. Denies myalgias.  Primary reason for surgery is radiculopathy and consistent back pain.   Labs (9/15): K 4.2, creatinine 0.94, LDL 78, HDL 31  ECG: NSR, LVH, old inferior MI  PMH:  1. C-spine arthritis 2. Low testosterone 3. Hyperlipidemia 4. CAD: ETT-Cardiolite 10/14 with below normal exercise capacity but EF 52% and no perfusion defects.  Inferior MI in 7/15 with totally occluded mid RCA, EF 40-45%, inferior akinesis.  Patient had DES to RCA [facility]).  Echo (9/15) with EF 60-65%, grade II diastolic dysfunction, mild MR, normal RV size and systolic function.  5. L-spine stenosis.   SH: Married, prior smoker, Administrator property  FH: No premature CAD   ROS: All systems reviewed and negative except as per HPI.   Current Outpatient Prescriptions  Medication Sig Dispense Refill  . lisinopril (PRINIVIL,ZESTRIL) 5 MG tablet Take 5 mg by mouth daily.    Marland Kitchen aspirin 81 MG tablet Take 81 mg by mouth daily.    Marland Kitchen  atorvastatin (LIPITOR) 80 MG tablet Take 80 mg by mouth daily.    Hinda Kehr 30 MG/ACT SOLN Take 1 tablet by mouth daily.    Marland Kitchen buPROPion (WELLBUTRIN XL) 300 MG 24 hr tablet Take 1 tablet by mouth daily.    . citalopram (CELEXA) 40 MG tablet Take by mouth daily.     . clopidogrel (PLAVIX) 75 MG tablet Take 75 mg by mouth daily.    Marland Kitchen doxycycline (VIBRA-TABS) 100 MG tablet   0  . esomeprazole (NEXIUM) 40 MG capsule Take 40 mg by mouth daily at 12 noon.    Marland Kitchen GLUCOSAMINE-CHONDROITIN PO Take 1 tablet by mouth daily.    Marland Kitchen LORazepam (ATIVAN) 0.5 MG tablet   0  . meloxicam (MOBIC) 15 MG tablet Take 15 mg by mouth daily.    . METHYLPHENIDATE 18 MG PO CR tablet Take 18 mg by mouth every morning.  0  . METHYLPHENIDATE 27 MG PO CR tablet Take 27 mg by mouth every morning.  0  . metoprolol succinate (TOPROL-XL) 50 MG 24 hr tablet Take 50 mg by mouth daily. Take with or immediately following a meal.    . Naftifine HCl (NAFTIN) 2 % CREA Apply 1 application topically 1 day or 1 dose. 60 g 2  . NITROSTAT 0.4 MG SL tablet as needed.    . Omega-3 Fatty Acids (FISH OIL) 1000 MG CAPS Take 1,000 mg by mouth  daily.    . tadalafil (CIALIS) 5 MG tablet Take 5 mg by mouth daily as needed for erectile dysfunction.    . triamcinolone cream (KENALOG) 0.1 %   0   No current facility-administered medications for this encounter.    BP 134/72 mmHg  Pulse 61  Wt 243 lb 4 oz (110.337 kg)  SpO2 95%   Wt Readings from Last 3 Encounters:  01/15/16 243 lb 4 oz (110.337 kg)  11/09/14 240 lb (108.863 kg)  08/25/14 237 lb 14 oz (107.9 kg)   General: NAD Neck: No JVD, no thyromegaly or lymphadenopathy noted.  Lungs: CTAB, normal effort. CV: Nondisplaced PMI.  Heart regular S1/S2, no +S4, no murmur.  Trace ankle edema.  No carotid bruit.  Normal pedal pulses.  Abdomen: Soft, NT, ND, no HSM. No bruits or masses. +BS  Skin: Intact without lesions or rashes.  Neurologic: Alert and oriented x 3.  Psych: Normal  affect. Extremities: No clubbing or cyanosis.   Assessment/Plan: 1. CAD: s/p inferior MI in 7/15 with DES to RCA.   - No symptoms.  - Echo 9/15 showed EF 60-65%. - Continue ASA 81. Can stop his Plavix 75 daily.  He is not a diabetic and it has been over a year since his stent placement. Low risk factors.  - Continue Toprol XL 50 mg daily and lisinopril 5 mg daily. - Continue atorvastatin 80 mg daily.   2. Hyperlipidemia:   - Last lipids 9/15 (good). States he had them checked in last few months.  3. Back Pain -  Scheduled for laminectomy/discectomy 01/17/16 - He is cleared for surgery  Will get labwork from PCP (within past few months per pt). He is cleared from surgery and can stop plavix.   Shirley Friar PA-C 01/15/2016   Patient seen with PA, agree with the above note.  He is doing quite well from a cardiac perspective, no dyspnea or chest pain.  His cardiac risk with surgery is certainly not prohibitive though will be mildly above normal give prior MI.  He had a stent placed > 1 year ago.  He may hold Plavix for the surgery.  Make sure to continue beta blocker peri-operatively.  Post-op, he should resume aspirin but does not need to go back on Plavix.   Followup in 1 year.   Loralie Champagne 01/15/2016 10:08 AM

## 2016-01-15 NOTE — Patient Instructions (Signed)
Stop Plavix.   Follow up: 1 year with Dr.McLean

## 2016-01-17 DIAGNOSIS — Z79899 Other long term (current) drug therapy: Secondary | ICD-10-CM | POA: Diagnosis not present

## 2016-01-17 DIAGNOSIS — M5431 Sciatica, right side: Secondary | ICD-10-CM | POA: Diagnosis not present

## 2016-01-17 DIAGNOSIS — M5432 Sciatica, left side: Secondary | ICD-10-CM | POA: Diagnosis not present

## 2016-01-17 DIAGNOSIS — I10 Essential (primary) hypertension: Secondary | ICD-10-CM | POA: Diagnosis not present

## 2016-01-17 DIAGNOSIS — M4726 Other spondylosis with radiculopathy, lumbar region: Secondary | ICD-10-CM | POA: Diagnosis not present

## 2016-01-17 DIAGNOSIS — E785 Hyperlipidemia, unspecified: Secondary | ICD-10-CM | POA: Diagnosis not present

## 2016-01-17 DIAGNOSIS — M4326 Fusion of spine, lumbar region: Secondary | ICD-10-CM | POA: Diagnosis not present

## 2016-01-17 DIAGNOSIS — I2510 Atherosclerotic heart disease of native coronary artery without angina pectoris: Secondary | ICD-10-CM | POA: Diagnosis not present

## 2016-01-17 DIAGNOSIS — I252 Old myocardial infarction: Secondary | ICD-10-CM | POA: Diagnosis not present

## 2016-01-17 DIAGNOSIS — M4806 Spinal stenosis, lumbar region: Secondary | ICD-10-CM | POA: Diagnosis not present

## 2016-01-18 DIAGNOSIS — M5432 Sciatica, left side: Secondary | ICD-10-CM | POA: Diagnosis not present

## 2016-01-18 DIAGNOSIS — E785 Hyperlipidemia, unspecified: Secondary | ICD-10-CM | POA: Diagnosis not present

## 2016-01-18 DIAGNOSIS — M4806 Spinal stenosis, lumbar region: Secondary | ICD-10-CM | POA: Diagnosis not present

## 2016-01-18 DIAGNOSIS — I10 Essential (primary) hypertension: Secondary | ICD-10-CM | POA: Diagnosis not present

## 2016-01-18 DIAGNOSIS — M5431 Sciatica, right side: Secondary | ICD-10-CM | POA: Diagnosis not present

## 2016-01-18 DIAGNOSIS — M4726 Other spondylosis with radiculopathy, lumbar region: Secondary | ICD-10-CM | POA: Diagnosis not present

## 2016-02-16 DIAGNOSIS — M4806 Spinal stenosis, lumbar region: Secondary | ICD-10-CM | POA: Diagnosis not present

## 2016-02-21 DIAGNOSIS — H52203 Unspecified astigmatism, bilateral: Secondary | ICD-10-CM | POA: Diagnosis not present

## 2016-02-21 DIAGNOSIS — H2513 Age-related nuclear cataract, bilateral: Secondary | ICD-10-CM | POA: Diagnosis not present

## 2016-03-07 DIAGNOSIS — L570 Actinic keratosis: Secondary | ICD-10-CM | POA: Diagnosis not present

## 2016-03-07 DIAGNOSIS — D0439 Carcinoma in situ of skin of other parts of face: Secondary | ICD-10-CM | POA: Diagnosis not present

## 2016-03-07 DIAGNOSIS — D1801 Hemangioma of skin and subcutaneous tissue: Secondary | ICD-10-CM | POA: Diagnosis not present

## 2016-03-07 DIAGNOSIS — L738 Other specified follicular disorders: Secondary | ICD-10-CM | POA: Diagnosis not present

## 2016-03-07 DIAGNOSIS — D485 Neoplasm of uncertain behavior of skin: Secondary | ICD-10-CM | POA: Diagnosis not present

## 2016-03-07 DIAGNOSIS — Z85828 Personal history of other malignant neoplasm of skin: Secondary | ICD-10-CM | POA: Diagnosis not present

## 2016-03-07 DIAGNOSIS — D225 Melanocytic nevi of trunk: Secondary | ICD-10-CM | POA: Diagnosis not present

## 2016-03-07 DIAGNOSIS — L821 Other seborrheic keratosis: Secondary | ICD-10-CM | POA: Diagnosis not present

## 2016-03-28 DIAGNOSIS — D0430 Carcinoma in situ of skin of unspecified part of face: Secondary | ICD-10-CM | POA: Diagnosis not present

## 2016-03-28 DIAGNOSIS — C44329 Squamous cell carcinoma of skin of other parts of face: Secondary | ICD-10-CM | POA: Diagnosis not present

## 2016-03-28 DIAGNOSIS — F900 Attention-deficit hyperactivity disorder, predominantly inattentive type: Secondary | ICD-10-CM | POA: Diagnosis not present

## 2016-03-28 DIAGNOSIS — Z85828 Personal history of other malignant neoplasm of skin: Secondary | ICD-10-CM | POA: Diagnosis not present

## 2016-05-15 DIAGNOSIS — L218 Other seborrheic dermatitis: Secondary | ICD-10-CM | POA: Diagnosis not present

## 2016-05-15 DIAGNOSIS — L570 Actinic keratosis: Secondary | ICD-10-CM | POA: Diagnosis not present

## 2016-05-15 DIAGNOSIS — D485 Neoplasm of uncertain behavior of skin: Secondary | ICD-10-CM | POA: Diagnosis not present

## 2016-05-15 DIAGNOSIS — L821 Other seborrheic keratosis: Secondary | ICD-10-CM | POA: Diagnosis not present

## 2016-05-15 DIAGNOSIS — D1801 Hemangioma of skin and subcutaneous tissue: Secondary | ICD-10-CM | POA: Diagnosis not present

## 2016-05-15 DIAGNOSIS — C44319 Basal cell carcinoma of skin of other parts of face: Secondary | ICD-10-CM | POA: Diagnosis not present

## 2016-05-15 DIAGNOSIS — Z85828 Personal history of other malignant neoplasm of skin: Secondary | ICD-10-CM | POA: Diagnosis not present

## 2016-06-21 DIAGNOSIS — M4806 Spinal stenosis, lumbar region: Secondary | ICD-10-CM | POA: Diagnosis not present

## 2016-06-21 DIAGNOSIS — M5432 Sciatica, left side: Secondary | ICD-10-CM | POA: Diagnosis not present

## 2016-06-21 DIAGNOSIS — M5126 Other intervertebral disc displacement, lumbar region: Secondary | ICD-10-CM | POA: Diagnosis not present

## 2016-06-21 DIAGNOSIS — M4726 Other spondylosis with radiculopathy, lumbar region: Secondary | ICD-10-CM | POA: Diagnosis not present

## 2016-07-01 DIAGNOSIS — C44319 Basal cell carcinoma of skin of other parts of face: Secondary | ICD-10-CM | POA: Diagnosis not present

## 2016-07-01 DIAGNOSIS — Z85828 Personal history of other malignant neoplasm of skin: Secondary | ICD-10-CM | POA: Diagnosis not present

## 2016-07-03 DIAGNOSIS — J029 Acute pharyngitis, unspecified: Secondary | ICD-10-CM | POA: Diagnosis not present

## 2016-07-08 DIAGNOSIS — I10 Essential (primary) hypertension: Secondary | ICD-10-CM | POA: Diagnosis not present

## 2016-07-08 DIAGNOSIS — K219 Gastro-esophageal reflux disease without esophagitis: Secondary | ICD-10-CM | POA: Diagnosis not present

## 2016-07-08 DIAGNOSIS — M4806 Spinal stenosis, lumbar region: Secondary | ICD-10-CM | POA: Diagnosis not present

## 2016-07-08 DIAGNOSIS — E7800 Pure hypercholesterolemia, unspecified: Secondary | ICD-10-CM | POA: Diagnosis not present

## 2016-07-08 DIAGNOSIS — N400 Enlarged prostate without lower urinary tract symptoms: Secondary | ICD-10-CM | POA: Diagnosis not present

## 2016-07-08 DIAGNOSIS — Z125 Encounter for screening for malignant neoplasm of prostate: Secondary | ICD-10-CM | POA: Diagnosis not present

## 2016-07-08 DIAGNOSIS — M5126 Other intervertebral disc displacement, lumbar region: Secondary | ICD-10-CM | POA: Diagnosis not present

## 2016-07-10 DIAGNOSIS — F900 Attention-deficit hyperactivity disorder, predominantly inattentive type: Secondary | ICD-10-CM | POA: Diagnosis not present

## 2016-07-17 DIAGNOSIS — M5126 Other intervertebral disc displacement, lumbar region: Secondary | ICD-10-CM | POA: Diagnosis not present

## 2016-07-18 DIAGNOSIS — I10 Essential (primary) hypertension: Secondary | ICD-10-CM | POA: Diagnosis not present

## 2016-07-18 DIAGNOSIS — Z125 Encounter for screening for malignant neoplasm of prostate: Secondary | ICD-10-CM | POA: Diagnosis not present

## 2016-07-18 DIAGNOSIS — Z23 Encounter for immunization: Secondary | ICD-10-CM | POA: Diagnosis not present

## 2016-07-18 DIAGNOSIS — E7800 Pure hypercholesterolemia, unspecified: Secondary | ICD-10-CM | POA: Diagnosis not present

## 2016-07-31 DIAGNOSIS — M5431 Sciatica, right side: Secondary | ICD-10-CM | POA: Diagnosis not present

## 2016-08-19 DIAGNOSIS — D72829 Elevated white blood cell count, unspecified: Secondary | ICD-10-CM | POA: Diagnosis not present

## 2016-09-05 DIAGNOSIS — L814 Other melanin hyperpigmentation: Secondary | ICD-10-CM | POA: Diagnosis not present

## 2016-09-05 DIAGNOSIS — Z85820 Personal history of malignant melanoma of skin: Secondary | ICD-10-CM | POA: Diagnosis not present

## 2016-09-05 DIAGNOSIS — D1801 Hemangioma of skin and subcutaneous tissue: Secondary | ICD-10-CM | POA: Diagnosis not present

## 2016-09-05 DIAGNOSIS — Z85828 Personal history of other malignant neoplasm of skin: Secondary | ICD-10-CM | POA: Diagnosis not present

## 2016-09-05 DIAGNOSIS — L570 Actinic keratosis: Secondary | ICD-10-CM | POA: Diagnosis not present

## 2016-09-05 DIAGNOSIS — D225 Melanocytic nevi of trunk: Secondary | ICD-10-CM | POA: Diagnosis not present

## 2016-09-05 DIAGNOSIS — L821 Other seborrheic keratosis: Secondary | ICD-10-CM | POA: Diagnosis not present

## 2016-09-05 DIAGNOSIS — D485 Neoplasm of uncertain behavior of skin: Secondary | ICD-10-CM | POA: Diagnosis not present

## 2016-10-08 DIAGNOSIS — F39 Unspecified mood [affective] disorder: Secondary | ICD-10-CM | POA: Diagnosis not present

## 2016-10-08 DIAGNOSIS — F900 Attention-deficit hyperactivity disorder, predominantly inattentive type: Secondary | ICD-10-CM | POA: Diagnosis not present

## 2016-10-24 DIAGNOSIS — L570 Actinic keratosis: Secondary | ICD-10-CM | POA: Diagnosis not present

## 2016-10-24 DIAGNOSIS — Z85828 Personal history of other malignant neoplasm of skin: Secondary | ICD-10-CM | POA: Diagnosis not present

## 2016-10-24 DIAGNOSIS — L820 Inflamed seborrheic keratosis: Secondary | ICD-10-CM | POA: Diagnosis not present

## 2016-11-19 DIAGNOSIS — M25511 Pain in right shoulder: Secondary | ICD-10-CM | POA: Diagnosis not present

## 2016-11-19 DIAGNOSIS — M1712 Unilateral primary osteoarthritis, left knee: Secondary | ICD-10-CM | POA: Diagnosis not present

## 2017-02-07 DIAGNOSIS — E7800 Pure hypercholesterolemia, unspecified: Secondary | ICD-10-CM | POA: Diagnosis not present

## 2017-02-07 DIAGNOSIS — I10 Essential (primary) hypertension: Secondary | ICD-10-CM | POA: Diagnosis not present

## 2017-02-07 DIAGNOSIS — K219 Gastro-esophageal reflux disease without esophagitis: Secondary | ICD-10-CM | POA: Diagnosis not present

## 2017-02-07 DIAGNOSIS — F324 Major depressive disorder, single episode, in partial remission: Secondary | ICD-10-CM | POA: Diagnosis not present

## 2017-02-07 DIAGNOSIS — N529 Male erectile dysfunction, unspecified: Secondary | ICD-10-CM | POA: Diagnosis not present

## 2017-02-07 DIAGNOSIS — I2510 Atherosclerotic heart disease of native coronary artery without angina pectoris: Secondary | ICD-10-CM | POA: Diagnosis not present

## 2017-03-27 DIAGNOSIS — Z85828 Personal history of other malignant neoplasm of skin: Secondary | ICD-10-CM | POA: Diagnosis not present

## 2017-03-27 DIAGNOSIS — D225 Melanocytic nevi of trunk: Secondary | ICD-10-CM | POA: Diagnosis not present

## 2017-03-27 DIAGNOSIS — B372 Candidiasis of skin and nail: Secondary | ICD-10-CM | POA: Diagnosis not present

## 2017-03-27 DIAGNOSIS — L821 Other seborrheic keratosis: Secondary | ICD-10-CM | POA: Diagnosis not present

## 2017-03-27 DIAGNOSIS — D1801 Hemangioma of skin and subcutaneous tissue: Secondary | ICD-10-CM | POA: Diagnosis not present

## 2017-03-27 DIAGNOSIS — L570 Actinic keratosis: Secondary | ICD-10-CM | POA: Diagnosis not present

## 2017-04-01 DIAGNOSIS — F900 Attention-deficit hyperactivity disorder, predominantly inattentive type: Secondary | ICD-10-CM | POA: Diagnosis not present

## 2017-04-03 ENCOUNTER — Encounter (HOSPITAL_COMMUNITY): Payer: Self-pay

## 2017-04-03 ENCOUNTER — Observation Stay (HOSPITAL_COMMUNITY)
Admission: EM | Admit: 2017-04-03 | Discharge: 2017-04-05 | Disposition: A | Discharge status: Home / Self Care | Payer: Medicare Other | Attending: Internal Medicine | Admitting: Internal Medicine

## 2017-04-03 ENCOUNTER — Emergency Department (HOSPITAL_COMMUNITY): Payer: Medicare Other

## 2017-04-03 DIAGNOSIS — Z87891 Personal history of nicotine dependence: Secondary | ICD-10-CM | POA: Diagnosis not present

## 2017-04-03 DIAGNOSIS — K219 Gastro-esophageal reflux disease without esophagitis: Secondary | ICD-10-CM | POA: Diagnosis not present

## 2017-04-03 DIAGNOSIS — Z7982 Long term (current) use of aspirin: Secondary | ICD-10-CM | POA: Diagnosis not present

## 2017-04-03 DIAGNOSIS — N400 Enlarged prostate without lower urinary tract symptoms: Secondary | ICD-10-CM | POA: Insufficient documentation

## 2017-04-03 DIAGNOSIS — R1031 Right lower quadrant pain: Secondary | ICD-10-CM | POA: Insufficient documentation

## 2017-04-03 DIAGNOSIS — F329 Major depressive disorder, single episode, unspecified: Secondary | ICD-10-CM | POA: Insufficient documentation

## 2017-04-03 DIAGNOSIS — I340 Nonrheumatic mitral (valve) insufficiency: Secondary | ICD-10-CM | POA: Insufficient documentation

## 2017-04-03 DIAGNOSIS — R0789 Other chest pain: Secondary | ICD-10-CM | POA: Diagnosis not present

## 2017-04-03 DIAGNOSIS — K449 Diaphragmatic hernia without obstruction or gangrene: Secondary | ICD-10-CM | POA: Diagnosis not present

## 2017-04-03 DIAGNOSIS — F988 Other specified behavioral and emotional disorders with onset usually occurring in childhood and adolescence: Secondary | ICD-10-CM | POA: Diagnosis present

## 2017-04-03 DIAGNOSIS — L570 Actinic keratosis: Secondary | ICD-10-CM | POA: Insufficient documentation

## 2017-04-03 DIAGNOSIS — R079 Chest pain, unspecified: Secondary | ICD-10-CM | POA: Diagnosis not present

## 2017-04-03 DIAGNOSIS — I200 Unstable angina: Secondary | ICD-10-CM

## 2017-04-03 DIAGNOSIS — Z955 Presence of coronary angioplasty implant and graft: Secondary | ICD-10-CM | POA: Diagnosis not present

## 2017-04-03 DIAGNOSIS — E7800 Pure hypercholesterolemia, unspecified: Secondary | ICD-10-CM

## 2017-04-03 DIAGNOSIS — I10 Essential (primary) hypertension: Secondary | ICD-10-CM | POA: Diagnosis not present

## 2017-04-03 DIAGNOSIS — F909 Attention-deficit hyperactivity disorder, unspecified type: Secondary | ICD-10-CM | POA: Diagnosis not present

## 2017-04-03 DIAGNOSIS — I2510 Atherosclerotic heart disease of native coronary artery without angina pectoris: Secondary | ICD-10-CM | POA: Diagnosis not present

## 2017-04-03 DIAGNOSIS — I25110 Atherosclerotic heart disease of native coronary artery with unstable angina pectoris: Principal | ICD-10-CM | POA: Insufficient documentation

## 2017-04-03 DIAGNOSIS — I209 Angina pectoris, unspecified: Secondary | ICD-10-CM | POA: Diagnosis not present

## 2017-04-03 DIAGNOSIS — I255 Ischemic cardiomyopathy: Secondary | ICD-10-CM | POA: Diagnosis present

## 2017-04-03 DIAGNOSIS — F419 Anxiety disorder, unspecified: Secondary | ICD-10-CM | POA: Diagnosis present

## 2017-04-03 DIAGNOSIS — E785 Hyperlipidemia, unspecified: Secondary | ICD-10-CM | POA: Diagnosis present

## 2017-04-03 DIAGNOSIS — R1011 Right upper quadrant pain: Secondary | ICD-10-CM

## 2017-04-03 DIAGNOSIS — I252 Old myocardial infarction: Secondary | ICD-10-CM | POA: Diagnosis not present

## 2017-04-03 DIAGNOSIS — I700 Atherosclerosis of aorta: Secondary | ICD-10-CM | POA: Insufficient documentation

## 2017-04-03 DIAGNOSIS — Z8249 Family history of ischemic heart disease and other diseases of the circulatory system: Secondary | ICD-10-CM | POA: Diagnosis not present

## 2017-04-03 DIAGNOSIS — R109 Unspecified abdominal pain: Secondary | ICD-10-CM

## 2017-04-03 HISTORY — DX: Anxiety disorder, unspecified: F41.9

## 2017-04-03 HISTORY — DX: Major depressive disorder, single episode, unspecified: F32.9

## 2017-04-03 HISTORY — DX: Hyperlipidemia, unspecified: E78.5

## 2017-04-03 HISTORY — DX: Atherosclerotic heart disease of native coronary artery without angina pectoris: I25.10

## 2017-04-03 HISTORY — DX: ST elevation (STEMI) myocardial infarction involving other coronary artery of inferior wall: I21.19

## 2017-04-03 HISTORY — DX: Sleep apnea, unspecified: G47.30

## 2017-04-03 HISTORY — DX: Gastro-esophageal reflux disease without esophagitis: K21.9

## 2017-04-03 HISTORY — DX: Reserved for concepts with insufficient information to code with codable children: IMO0002

## 2017-04-03 HISTORY — DX: Malignant melanoma of left upper limb, including shoulder: C43.62

## 2017-04-03 HISTORY — DX: Unspecified osteoarthritis, unspecified site: M19.90

## 2017-04-03 HISTORY — DX: Other specified behavioral and emotional disorders with onset usually occurring in childhood and adolescence: F98.8

## 2017-04-03 HISTORY — DX: Essential (primary) hypertension: I10

## 2017-04-03 HISTORY — DX: Basal cell carcinoma of skin, unspecified: C44.91

## 2017-04-03 HISTORY — DX: Depression, unspecified: F32.A

## 2017-04-03 HISTORY — DX: Actinic keratosis: L57.0

## 2017-04-03 LAB — CBC
HCT: 44.5 % (ref 39.0–52.0)
Hemoglobin: 15.2 g/dL (ref 13.0–17.0)
MCH: 30.6 pg (ref 26.0–34.0)
MCHC: 34.2 g/dL (ref 30.0–36.0)
MCV: 89.5 fL (ref 78.0–100.0)
Platelets: 240 10*3/uL (ref 150–400)
RBC: 4.97 MIL/uL (ref 4.22–5.81)
RDW: 13.1 % (ref 11.5–15.5)
WBC: 10.5 10*3/uL (ref 4.0–10.5)

## 2017-04-03 LAB — HEPATIC FUNCTION PANEL
ALT: 28 U/L (ref 17–63)
AST: 25 U/L (ref 15–41)
Albumin: 4.1 g/dL (ref 3.5–5.0)
Alkaline Phosphatase: 81 U/L (ref 38–126)
Bilirubin, Direct: 0.2 mg/dL (ref 0.1–0.5)
Indirect Bilirubin: 0.5 mg/dL (ref 0.3–0.9)
Total Bilirubin: 0.7 mg/dL (ref 0.3–1.2)
Total Protein: 6.9 g/dL (ref 6.5–8.1)

## 2017-04-03 LAB — BASIC METABOLIC PANEL
Anion gap: 10 (ref 5–15)
BUN: 23 mg/dL — ABNORMAL HIGH (ref 6–20)
CO2: 21 mmol/L — ABNORMAL LOW (ref 22–32)
Calcium: 9.3 mg/dL (ref 8.9–10.3)
Chloride: 106 mmol/L (ref 101–111)
Creatinine, Ser: 0.84 mg/dL (ref 0.61–1.24)
GFR calc Af Amer: >60 mL/min (ref >60)
GFR calc non Af Amer: >60 mL/min (ref >60)
Glucose, Bld: 141 mg/dL — ABNORMAL HIGH (ref 65–99)
Potassium: 4.2 mmol/L (ref 3.5–5.1)
Sodium: 137 mmol/L (ref 135–145)

## 2017-04-03 LAB — I-STAT TROPONIN, ED: Troponin i, poc: 0 ng/mL (ref 0.00–0.08)

## 2017-04-03 LAB — TROPONIN I: Troponin I: <0.03 ng/mL (ref <0.03)

## 2017-04-03 LAB — MAGNESIUM: Magnesium: 1.9 mg/dL (ref 1.7–2.4)

## 2017-04-03 LAB — LIPASE, BLOOD: Lipase: 20 U/L (ref 11–51)

## 2017-04-03 MED ORDER — METHYLPHENIDATE HCL ER (OSM) 36 MG PO TBCR: 36.0000 mg | EXTENDED_RELEASE_TABLET | Freq: Every day | ORAL | Status: DC

## 2017-04-03 MED ORDER — SODIUM CHLORIDE 0.9% FLUSH
3.0000 mL | Freq: Two times a day (BID) | INTRAVENOUS | Status: DC
  Administered 2017-04-03 – 2017-04-04 (×2): 3 mL via INTRAVENOUS

## 2017-04-03 MED ORDER — ACETAMINOPHEN 325 MG PO TABS: 650.0000 mg | ORAL_TABLET | Freq: Four times a day (QID) | ORAL | Status: DC | PRN

## 2017-04-03 MED ORDER — SODIUM CHLORIDE 0.9 % IV SOLN: 250.0000 mL | INTRAVENOUS | Status: DC | PRN

## 2017-04-03 MED ORDER — MORPHINE SULFATE (PF) 4 MG/ML IV SOLN
4.0000 mg | Freq: Once | INTRAVENOUS | Status: AC
  Administered 2017-04-03: 4 mg via INTRAVENOUS
  Filled 2017-04-03: qty 1

## 2017-04-03 MED ORDER — SODIUM CHLORIDE 0.9 % IV BOLUS (SEPSIS)
1000.0000 mL | Freq: Once | INTRAVENOUS | Status: AC
  Administered 2017-04-03: 1000 mL via INTRAVENOUS

## 2017-04-03 MED ORDER — LISINOPRIL 5 MG PO TABS
5.0000 mg | ORAL_TABLET | Freq: Every day | ORAL | Status: DC
  Administered 2017-04-04 – 2017-04-05 (×2): 5 mg via ORAL
  Filled 2017-04-03 (×2): qty 1

## 2017-04-03 MED ORDER — METHYLPHENIDATE HCL ER 20 MG PO TBCR: 40.0000 mg | EXTENDED_RELEASE_TABLET | Freq: Every morning | ORAL | Status: DC

## 2017-04-03 MED ORDER — SODIUM CHLORIDE 0.9% FLUSH: 3.0000 mL | INTRAVENOUS | Status: DC | PRN

## 2017-04-03 MED ORDER — BUPROPION HCL ER (XL) 300 MG PO TB24
300.0000 mg | ORAL_TABLET | Freq: Every day | ORAL | Status: DC
  Administered 2017-04-04 – 2017-04-05 (×2): 300 mg via ORAL
  Filled 2017-04-03 (×2): qty 1

## 2017-04-03 MED ORDER — NITROGLYCERIN 0.4 MG SL SUBL
0.4000 mg | SUBLINGUAL_TABLET | SUBLINGUAL | Status: DC | PRN
Start: 2017-04-03 — End: 2017-04-05

## 2017-04-03 MED ORDER — CITALOPRAM HYDROBROMIDE 20 MG PO TABS
40.0000 mg | ORAL_TABLET | Freq: Every day | ORAL | Status: DC
  Administered 2017-04-04: 40 mg via ORAL
  Filled 2017-04-03: qty 2

## 2017-04-03 MED ORDER — METOPROLOL SUCCINATE ER 50 MG PO TB24
50.0000 mg | ORAL_TABLET | Freq: Every day | ORAL | Status: DC
  Administered 2017-04-04 – 2017-04-05 (×2): 50 mg via ORAL
  Filled 2017-04-03 (×2): qty 1

## 2017-04-03 MED ORDER — ASPIRIN EC 81 MG PO TBEC: 81.0000 mg | DELAYED_RELEASE_TABLET | Freq: Every day | ORAL | Status: DC

## 2017-04-03 MED ORDER — ONDANSETRON HCL 4 MG/2ML IJ SOLN
4.0000 mg | Freq: Once | INTRAMUSCULAR | Status: AC | PRN
  Administered 2017-04-03: 4 mg via INTRAVENOUS
  Filled 2017-04-03: qty 2

## 2017-04-03 MED ORDER — ONDANSETRON HCL 4 MG PO TABS: 4.0000 mg | ORAL_TABLET | Freq: Four times a day (QID) | ORAL | Status: DC | PRN

## 2017-04-03 MED ORDER — FAMOTIDINE IN NACL 20-0.9 MG/50ML-% IV SOLN
20.0000 mg | Freq: Once | INTRAVENOUS | Status: AC
  Administered 2017-04-03: 20 mg via INTRAVENOUS
  Filled 2017-04-03: qty 50

## 2017-04-03 MED ORDER — CHLORHEXIDINE GLUCONATE 0.12 % MT SOLN
15.0000 mL | Freq: Two times a day (BID) | OROMUCOSAL | Status: DC
  Administered 2017-04-03 – 2017-04-05 (×4): 15 mL via OROMUCOSAL
  Filled 2017-04-03 (×4): qty 15

## 2017-04-03 MED ORDER — ALPRAZOLAM 0.5 MG PO TABS: 0.5000 mg | ORAL_TABLET | Freq: Every evening | ORAL | Status: DC | PRN

## 2017-04-03 MED ORDER — ATORVASTATIN CALCIUM 80 MG PO TABS
80.0000 mg | ORAL_TABLET | Freq: Every day | ORAL | Status: DC
  Administered 2017-04-04 – 2017-04-05 (×2): 80 mg via ORAL
  Filled 2017-04-03 (×2): qty 1

## 2017-04-03 MED ORDER — ASPIRIN 81 MG PO CHEW
81.0000 mg | CHEWABLE_TABLET | ORAL | Status: AC
  Administered 2017-04-04: 81 mg via ORAL
  Filled 2017-04-03: qty 1

## 2017-04-03 MED ORDER — ONDANSETRON HCL 4 MG/2ML IJ SOLN
4.0000 mg | Freq: Once | INTRAMUSCULAR | Status: AC
  Administered 2017-04-03: 4 mg via INTRAVENOUS
  Filled 2017-04-03: qty 2

## 2017-04-03 MED ORDER — PANTOPRAZOLE SODIUM 40 MG PO TBEC
40.0000 mg | DELAYED_RELEASE_TABLET | Freq: Every day | ORAL | Status: DC
Start: 2017-04-04 — End: 2017-04-05
  Administered 2017-04-04 – 2017-04-05 (×2): 40 mg via ORAL
  Filled 2017-04-03 (×2): qty 1

## 2017-04-03 MED ORDER — MORPHINE SULFATE (PF) 4 MG/ML IV SOLN
4.0000 mg | INTRAVENOUS | Status: DC | PRN
  Administered 2017-04-04: 4 mg via INTRAVENOUS
  Filled 2017-04-03: qty 1

## 2017-04-03 MED ORDER — HEPARIN (PORCINE) IN NACL 100-0.45 UNIT/ML-% IJ SOLN
1500.0000 [IU]/h | INTRAMUSCULAR | Status: DC
  Administered 2017-04-03: 1350 [IU]/h via INTRAVENOUS
  Administered 2017-04-04: 1500 [IU]/h via INTRAVENOUS
  Filled 2017-04-03 (×2): qty 250

## 2017-04-03 MED ORDER — METHYLPHENIDATE HCL ER 18 MG PO TB24
36.0000 mg | ORAL_TABLET | Freq: Every day | ORAL | Status: DC
  Administered 2017-04-04 – 2017-04-05 (×2): 36 mg via ORAL
  Filled 2017-04-03 (×2): qty 2

## 2017-04-03 MED ORDER — SODIUM CHLORIDE 0.9 % WEIGHT BASED INFUSION
1.0000 mL/kg/h | INTRAVENOUS | Status: DC
  Administered 2017-04-04: 1 mL/kg/h via INTRAVENOUS

## 2017-04-03 MED ORDER — ONDANSETRON HCL 4 MG/2ML IJ SOLN: 4.0000 mg | Freq: Four times a day (QID) | INTRAMUSCULAR | Status: DC | PRN

## 2017-04-03 MED ORDER — IOPAMIDOL (ISOVUE-370) INJECTION 76%
INTRAVENOUS | Status: AC
  Administered 2017-04-03: 100 mL
  Filled 2017-04-03: qty 100

## 2017-04-03 MED ORDER — SODIUM CHLORIDE 0.9 % WEIGHT BASED INFUSION
3.0000 mL/kg/h | INTRAVENOUS | Status: DC
  Administered 2017-04-04: 3 mL/kg/h via INTRAVENOUS

## 2017-04-03 MED ORDER — HEPARIN BOLUS VIA INFUSION
4000.0000 [IU] | Freq: Once | INTRAVENOUS | Status: AC
  Administered 2017-04-03: 4000 [IU] via INTRAVENOUS
  Filled 2017-04-03: qty 4000

## 2017-04-03 MED ORDER — KETOCONAZOLE 2 % EX CREA
TOPICAL_CREAM | Freq: Every day | CUTANEOUS | Status: DC
  Administered 2017-04-05: 09:00:00 via TOPICAL
  Filled 2017-04-03: qty 15

## 2017-04-03 NOTE — ED Provider Notes (Signed)
[facility] DEPT Provider Note   CSN: [id number] Arrival date & time: 04/03/17  1346     History   Chief Complaint Chief Complaint  Patient presents with  . Chest Pain    HPI Kenneth Henderson is a 66 y.o. male.  66 yo M with a cc of epigastric abdominal pain.  Going on for about 4 days.  Denies exertional symptoms. Having some nausea but denies vomiting. Complains of tingling to bilateral arms. This improved with some nitroglycerin but continued to have the abdominal pain. Points to the epigastrium but really feels that it's diffuse. Seems to be somewhat colicky. Denies diarrhea. Denies fevers. He has a history of an MI about 2 years ago that had similar symptoms. Required stent that time.   The history is provided by the patient.  Chest Pain   This is a new problem. The current episode started less than 1 hour ago. The problem occurs constantly. The problem has not changed since onset.The pain is present in the epigastric region. The pain is at a severity of 6/10. The pain is severe. The quality of the pain is described as sharp, heavy and burning. The pain does not radiate. Pertinent negatives include no abdominal pain, no fever, no headaches, no palpitations, no shortness of breath and no vomiting.  His past medical history is significant for MI.  Pertinent negatives for past medical history include no PE.    Past Medical History:  Diagnosis Date  . Coronary artery disease   . Hypertension     Patient Active Problem List   Diagnosis Date Noted  . Hyperlipidemia 11/10/2014  . CAD (coronary artery disease) 08/12/2014  . Cardiomyopathy, ischemic 08/12/2014  . Equivalent angina ([place]) 10/05/2013  . Elevated BP 10/05/2013    Past Surgical History:  Procedure Laterality Date  . BACK SURGERY    . CORONARY ANGIOPLASTY WITH STENT PLACEMENT    . DENTAL SURGERY    . HERNIA REPAIR         Home Medications    Prior to Admission medications   Medication Sig Start Date End  Date Taking? Authorizing Provider  aspirin 81 MG tablet Take 81 mg by mouth daily.    Historical Provider, MD  atorvastatin (LIPITOR) 80 MG tablet Take 80 mg by mouth daily.    Historical Provider, MD  AXIRON 30 MG/ACT SOLN Take 1 tablet by mouth daily. 09/30/13   Historical Provider, MD  buPROPion (WELLBUTRIN XL) 300 MG 24 hr tablet Take 1 tablet by mouth daily. 10/04/13   Historical Provider, MD  citalopram (CELEXA) 40 MG tablet Take by mouth daily.  10/04/13   Historical Provider, MD  doxycycline (VIBRA-TABS) 100 MG tablet  11/09/14   Historical Provider, MD  esomeprazole (NEXIUM) 40 MG capsule Take 40 mg by mouth daily at 12 noon.    Historical Provider, MD  GLUCOSAMINE-CHONDROITIN PO Take 1 tablet by mouth daily.    Historical Provider, MD  lisinopril (PRINIVIL,ZESTRIL) 5 MG tablet Take 5 mg by mouth daily.    Historical Provider, MD  LORazepam (ATIVAN) 0.5 MG tablet  09/26/14   Historical Provider, MD  meloxicam (MOBIC) 15 MG tablet Take 15 mg by mouth daily.    Historical Provider, MD  METHYLPHENIDATE 18 MG PO CR tablet Take 18 mg by mouth every morning. 10/11/14   Historical Provider, MD  METHYLPHENIDATE 27 MG PO CR tablet Take 27 mg by mouth every morning. 11/10/14   Historical Provider, MD  metoprolol succinate (TOPROL-XL) 50 MG 24 hr  tablet Take 50 mg by mouth daily. Take with or immediately following a meal.    Historical Provider, MD  Naftifine HCl (NAFTIN) 2 % CREA Apply 1 application topically 1 day or 1 dose. 11/30/14   Trula Slade, DPM  NITROSTAT 0.4 MG SL tablet as needed. 09/23/13   Historical Provider, MD  Omega-3 Fatty Acids (FISH OIL) 1000 MG CAPS Take 1,000 mg by mouth daily.    Historical Provider, MD  tadalafil (CIALIS) 5 MG tablet Take 5 mg by mouth daily as needed for erectile dysfunction.    Historical Provider, MD  triamcinolone cream (KENALOG) 0.1 %  09/06/14   Historical Provider, MD    Family History No family history on file.  Social History Social History    Substance Use Topics  . Smoking status: Former Research scientist (life sciences)  . Smokeless tobacco: Never Used  . Alcohol use No     Allergies   Patient has no known allergies.   Review of Systems Review of Systems  Constitutional: Negative for chills and fever.  HENT: Negative for congestion and facial swelling.   Eyes: Negative for discharge and visual disturbance.  Respiratory: Negative for shortness of breath.   Cardiovascular: Negative for chest pain and palpitations.  Gastrointestinal: Negative for abdominal pain, diarrhea and vomiting.  Musculoskeletal: Negative for arthralgias and myalgias.  Skin: Negative for color change and rash.  Neurological: Negative for tremors, syncope and headaches.  Psychiatric/Behavioral: Negative for confusion and dysphoric mood.     Physical Exam Updated Vital Signs BP (!) 163/83   Pulse 65   Temp 98 F (36.7 C) (Oral)   Resp (!) 32   Wt 235 lb (106.6 kg)   SpO2 99%   BMI 33.24 kg/m   Physical Exam  Constitutional: He is oriented to person, place, and time. He appears well-developed and well-nourished.  HENT:  Head: Normocephalic and atraumatic.  Eyes: EOM are normal. Pupils are equal, round, and reactive to light.  Neck: Normal range of motion. Neck supple. No JVD present.  Cardiovascular: Normal rate and regular rhythm.  Exam reveals no gallop and no friction rub.   No murmur heard. Pulmonary/Chest: No respiratory distress. He has no wheezes.  Abdominal: He exhibits no distension and no mass. There is tenderness (diffuse). There is no rebound and no guarding.  Musculoskeletal: Normal range of motion.  Neurological: He is alert and oriented to person, place, and time.  Skin: No rash noted. He is diaphoretic. No pallor.  Psychiatric: He has a normal mood and affect. His behavior is normal.  Nursing note and vitals reviewed.    ED Treatments / Results  Labs (all labs ordered are listed, but only abnormal results are displayed) Labs Reviewed   BASIC METABOLIC PANEL - Abnormal; Notable for the following:       Result Value   CO2 21 (*)    Glucose, Bld 141 (*)    BUN 23 (*)    All other components within normal limits  CBC  LIPASE, BLOOD  HEPATIC FUNCTION PANEL  I-STAT TROPOININ, ED    EKG  EKG Interpretation  Date/Time:  Thursday April 03 2017 13:46:37 EDT Ventricular Rate:  53 PR Interval:    QRS Duration: 121 QT Interval:  486 QTC Calculation: 457 R Axis:   50 Text Interpretation:  Sinus rhythm Nonspecific intraventricular conduction delay No significant change since last tracing Confirmed by Janise Gora MD, Quillian Quince ([id number]) on 04/03/2017 2:00:06 PM       Radiology Dg Chest Portable 1  View  Result Date: 04/03/2017 CLINICAL DATA:  Generalized chest pain with bilateral arm numbness as well as abdominal pain. EXAM: PORTABLE CHEST 1 VIEW COMPARISON:  Chest x-ray of August 31st 2009 FINDINGS: The lungs are adequately inflated and clear. The heart and pulmonary vascularity are normal. The mediastinum is normal in width. There is no pleural effusion. There is calcification in the wall of the aortic arch. The observed bony structures are unremarkable. IMPRESSION: There is no acute cardiopulmonary abnormality. Thoracic aortic atherosclerosis. Electronically Signed   By: David  Martinique M.D.   On: 04/03/2017 14:05   Ct Angio Chest/abd/pel For Dissection W And/or Wo Contrast  Result Date: 04/03/2017 CLINICAL DATA:  66 year old male with pain all over and tingling in the arms. EXAM: CT ANGIOGRAPHY CHEST, ABDOMEN AND PELVIS TECHNIQUE: Multidetector CT imaging through the chest, abdomen and pelvis was performed using the standard protocol during bolus administration of intravenous contrast. Multiplanar reconstructed images and MIPs were obtained and reviewed to evaluate the vascular anatomy. Before administration of the IV contrast, a noncontrast chest CT was acquired. CONTRAST:  100 cc Isovue 370 COMPARISON:  Chest x-ray 04/03/2017 FINDINGS:  CTA CHEST FINDINGS Cardiovascular: Heart: No cardiomegaly. No pericardial fluid/thickening. Calcifications of left main, left anterior descending, circumflex, right coronary arteries. Aorta: Diameter of ascending aorta measures 3.2 cm. No dissection. No periaortic fluid. Mild atherosclerotic changes of the aortic arch. Branch vessels are patent. Three vessel arch. Mild atherosclerotic changes of the descending thoracic aorta. No aneurysm. Pulmonary arteries: Timing of the contrast bolus not optimized for evaluation of pulmonary arteries, however, no lobar, segmental, or proximal subsegmental filling defects. Mediastinum/Nodes: Multiple calcified mediastinal lymph nodes, predominantly subcarinal in in the right hilum. Additional lymph nodes throughout all nodal stations which are not enlarged by CT size criteria. No comparison available. Unremarkable course of the esophagus.  Small hiatal hernia. Lungs/Pleura: Calcified granuloma at the right lung base. Atelectasis/scarring at the bilateral dependent lung bases. No confluent airspace disease. No pneumothorax. No pleural effusion. No pneumothorax. Musculoskeletal: No displaced fracture. Degenerative changes of the spine. Review of the MIP images confirms the above findings. CTA ABDOMEN AND PELVIS FINDINGS VASCULAR Aorta: No dissection. No aneurysm. No periaortic fluid. Atherosclerotic changes of the abdominal aorta with mixed calcified and soft plaque. Celiac: No significant atherosclerotic changes at the origin of the celiac artery. Typical branch pattern. SMA: Atherosclerotic changes superior mesenteric artery without significant stenosis. Renals: Bilateral renal arteries with mild atherosclerotic changes and no significant stenosis. IMA: Inferior mesenteric artery is patent. Left colic artery patent. Superior rectal artery patent. Right lower extremity: Mild atherosclerotic changes of the right iliac system without aneurysm, dissection, or occlusion. External iliac  artery patent. Hypogastric artery patent. Proximal right femoral vasculature patent. Left lower extremity: Atherosclerotic changes of the left iliac system without stenosis, occlusion, dissection, or aneurysm. Hypogastric artery is patent. Left external iliac artery patent. Proximal femoral vasculature patent. Veins: Unremarkable appearance of the venous system. Review of the MIP images confirms the above findings. NON-VASCULAR Hepatobiliary: Unremarkable appearance of the liver. Unremarkable gall bladder. Pancreas: Unremarkable appearance of the pancreas. No pericholecystic fluid or inflammatory changes. Unremarkable ductal system. Spleen: Punctate calcifications within splenic parenchyma. Adrenals/Urinary Tract: Unremarkable appearance of adrenal glands. Right: No hydronephrosis. Symmetric perfusion to the left. No nephrolithiasis. Unremarkable course of the right ureter. Left: No hydronephrosis. Symmetric perfusion to the right. No nephrolithiasis. Unremarkable course of the left ureter. Circumferential bladder wall thickening.  No inflammatory changes. Stomach/Bowel: Unremarkable appearance of the stomach. Unremarkable appearance of small bowel.  No evidence of obstruction. Colonic diverticular without evidence of acute diverticulitis. Normal appendix. Lymphatic: Multiple lymph nodes in the para-aortic nodal station, none of which are enlarged. Mesenteric: No free fluid or air. No adenopathy. Reproductive: Transverse diameter of the prostate measures 6.1 cm. Other: No hernia. Musculoskeletal: No acute fracture. Multilevel degenerative changes of the spine. No bony canal narrowing. Vacuum disc phenomenon at L1-L2 and L2-L3. Advanced degenerative disc disease at L5-S1. Mild bilateral degenerative changes of the hips. IMPRESSION: No CT evidence of acute aortic syndrome. No acute finding to account for the patient's symptoms. Enlarged prostate, with circumferential bladder wall thickening, potentially representing  chronic bladder outlet obstruction. Aortic atherosclerosis, as well as left main and 3 vessel coronary artery disease. Evidence of prior granulomatous disease. Small hiatal hernia. Diverticular disease without evidence of acute diverticulitis. Signed, Dulcy Fanny. Earleen Newport, DO [facility] Electronically Signed   By: Corrie Mckusick D.O.   On: 04/03/2017 16:05    Procedures Procedures (including critical care time)  Medications Ordered in ED Medications  ondansetron (ZOFRAN) injection 4 mg (4 mg Intravenous Given 04/03/17 1358)  morphine 4 MG/ML injection 4 mg (4 mg Intravenous Given 04/03/17 1511)  ondansetron (ZOFRAN) injection 4 mg (4 mg Intravenous Given 04/03/17 1511)  sodium chloride 0.9 % bolus 1,000 mL (1,000 mLs Intravenous New Bag/Given 04/03/17 1511)  iopamidol (ISOVUE-370) 76 % injection (100 mLs  Contrast Given 04/03/17 1530)     Initial Impression / Assessment and Plan / ED Course  I have reviewed the triage vital signs and the nursing notes.  Pertinent labs & imaging results that were available during my care of the patient were reviewed by me and considered in my medical decision making (see chart for details).     66 yo M With a chief complaint of abdominal pain. This feels like the patient's prior MI. His EKG is unchanged from prior as well as his troponin is completely negative. 40s of symptoms that expect there to be some elevation of troponin. Her more concerned about the patient's abdominal pain. That with tingling in his hands and him looking generally unwell I'm concerned that he may have a dissection. I will do a dissection protocol. Treat pain and nausea. If negative will discuss with cardiology for similar symptoms and prior MI.  Negative CT scan, of note they did comment on diffuse CAD.  Will discuss with cards.   Patient reassessed and feeling much better.   The patients results and plan were reviewed and  discussed.   Any x-rays performed were independently reviewed by myself.   Differential diagnosis were considered with the presenting HPI.  Medications  ondansetron (ZOFRAN) injection 4 mg (4 mg Intravenous Given 04/03/17 1358)  morphine 4 MG/ML injection 4 mg (4 mg Intravenous Given 04/03/17 1511)  ondansetron (ZOFRAN) injection 4 mg (4 mg Intravenous Given 04/03/17 1511)  sodium chloride 0.9 % bolus 1,000 mL (1,000 mLs Intravenous New Bag/Given 04/03/17 1511)  iopamidol (ISOVUE-370) 76 % injection (100 mLs  Contrast Given 04/03/17 1530)    Vitals:   04/03/17 1350 04/03/17 1415 04/03/17 1445 04/03/17 1515  BP: (!) 175/85 (!) 174/75 (!) 169/87 (!) 163/83  Pulse: (!) 50 (!) 55 (!) 56 65  Resp: 20 (!) 23 (!) 24 (!) 32  Temp: 98 F (36.7 C)     TempSrc: Oral     SpO2: 99% 99% 100% 99%  Weight: 235 lb (106.6 kg)       Final diagnoses:  Chest  pain with moderate risk for cardiac etiology    Admission/ observation were discussed with the admitting physician, patient and/or family and they are comfortable with the plan.    Final Clinical Impressions(s) / ED Diagnoses   Final diagnoses:  Chest pain with moderate risk for cardiac etiology    New Prescriptions New Prescriptions   No medications on file     Deno Etienne, DO 04/03/17 1620

## 2017-04-03 NOTE — ED Notes (Signed)
Cardiology MD at bedside.

## 2017-04-03 NOTE — H&P (Signed)
History and Physical    Kenneth Henderson SWF:[id number] DOB: [birth year] DOA: 04/03/2017  PCP: Shirline Frees, MD   Patient coming from: Home.  I have personally briefly reviewed patient's old medical records in [place]  Chief Complaint: Chest and abdominal pain.  HPI: Kenneth Henderson is a 66 y.o. male with medical history significant of actinic keratosis, anxiety, ADD, hyperlipidemia, hypertension, CAD, history of inferior MI in July 2015 with 100 % occlusion to the RCA, S/P PCI with DES was coming to the emergency department with complaints of abdominal pain for the past 3-4 days, which is associated today with left-sided chest pain radiates to his left arm and tingling of both hands.   Per patient, he had a dental procedure about a week and a half ago. He has been taking 4 tablets of Aleve in the morning and 3 in the evening for pain since then. About 3-4 days ago he started noticing abdominal pain associated with nausea, but no vomiting. Today his symptoms got worse and then he developed chest pain, burning like, radiates to his left arm and associated with tingling of both hands. These symptoms were similar to the the ones he had in 2015 when he was diagnosed with acute MI. He stated that he took sublingual nitroglycerin which relieved the symptoms. He denies fever, chills, productive cough, dyspnea, palpitations, PND, orthopnea or pitting edema of the lower extremities. He denies emesis, diarrhea, constipation, melena or hematochezia. Denies dysuria or frequency.  ED Course: His EKG showed no nonspecific intraventricular conduction delay. Troponin level was negative, CBC and LFTs were normal. His BMP shows a CO2 of 21 mmol/L, glucose of 141 and BUN of 23 mg/dL.   Imaging: CT scan of abdomen/pelvis showed atherosclerosis of aorta, enlarged prostate and circumferential bladder wall thickening, but no acute finding.  The patient was started on a heparin infusion after being evaluated by  cardiology who will perform a cardiac catheterization.   Review of Systems: As per HPI otherwise 10 point review of systems negative.    Past Medical History:  Diagnosis Date  . Actinic keratosis treated with topical fluorouracil (5FU)   . Anxiety   . Attention deficit disorder (ADD) in adult   . Coronary artery disease     CAD with cath 06/2014 in setting of inferior MI showing occluded RCA and underwent PCI with DES to the RCA.  EF at that time was 40-45% and echo 9/15 showed EF 60-65% with normal RV in [place]  . Hyperlipidemia LDL goal <70   . Hypertension     Past Surgical History:  Procedure Laterality Date  . BACK SURGERY    . CORONARY ANGIOPLASTY WITH STENT PLACEMENT    . DENTAL SURGERY    . HERNIA REPAIR       reports that he has quit smoking. He has never used smokeless tobacco. He reports that he does not drink alcohol or use drugs.  No Known Allergies  Family History  Problem Relation Age of Onset  . Heart failure Mother   . Alzheimer's disease Father   . Cancer Sister   . Heart attack Brother 56  . CAD Brother   . Melanoma Brother   . CAD Brother   . Healthy Brother     Prior to Admission medications   Medication Sig Start Date End Date Taking? Authorizing Provider  aspirin 81 MG tablet Take 81 mg by mouth daily.   Yes Historical Provider, MD  atorvastatin (LIPITOR) 80 MG tablet Take 80  mg by mouth daily.   Yes Historical Provider, MD  azithromycin (ZITHROMAX) 500 MG tablet Take 500 mg by mouth daily. 04/02/17  Yes Historical Provider, MD  buPROPion (WELLBUTRIN XL) 300 MG 24 hr tablet Take 300 mg by mouth daily.  10/04/13  Yes Historical Provider, MD  chlorhexidine (PERIDEX) 0.12 % solution RINSE MOUTH WITH 15ML (1 CAPFUL) FOR 30 SECONDS am AND PM AFTER TOOTHBRUSHING. EXPECTORATE AFTER RINSING, DO NOT SWALLOW 03/14/17  Yes Historical Provider, MD  citalopram (CELEXA) 40 MG tablet Take 40 mg by mouth daily.  10/04/13  Yes Historical Provider, MD  esomeprazole  (NEXIUM) 40 MG capsule Take 40 mg by mouth daily at 12 noon.   Yes Historical Provider, MD  fluorouracil (EFUDEX) 5 % cream APPLY TO AFFECTED AREA OF SKIN 2 TIMES DAILY AS DIRECTED FOR 2 WEEKS 03/27/17  Yes Historical Provider, MD  GLUCOSAMINE-CHONDROITIN PO Take 1 tablet by mouth daily.   Yes Historical Provider, MD  ketoconazole (NIZORAL) 2 % cream APPLY TO AFFECTED AREA 2 TIMES DAILY FOR 2 WEEKS 03/27/17  Yes Historical Provider, MD  lisinopril (PRINIVIL,ZESTRIL) 5 MG tablet Take 5 mg by mouth daily.   Yes Historical Provider, MD  methylphenidate (METADATE CD) 40 MG CR capsule Take 40 mg by mouth every morning. 03/10/17  Yes Historical Provider, MD  metoprolol succinate (TOPROL-XL) 50 MG 24 hr tablet Take 50 mg by mouth daily. Take with or immediately following a meal.   Yes Historical Provider, MD  NITROSTAT 0.4 MG SL tablet Place 0.4 mg under the tongue every 5 (five) minutes as needed for chest pain.  09/23/13  Yes Historical Provider, MD  Omega-3 Fatty Acids (FISH OIL) 1000 MG CAPS Take 1,000 mg by mouth daily.   Yes Historical Provider, MD  PREVIDENT 5000 SENSITIVE 1.1-5 % PSTE BRUSH ON teeth 2 TIMES DAILY FOR 2 minutes. spit out as much possible. DO not SWISH, eat, OR drink FOR 30 minutes 01/01/17  Yes Historical Provider, MD  Naftifine HCl (NAFTIN) 2 % CREA Apply 1 application topically 1 day or 1 dose. Patient not taking: Reported on 04/03/2017 11/30/14   Trula Slade, DPM    Physical Exam:  Constitutional: NAD, calm, comfortable Vitals:   04/03/17 1800 04/03/17 1830 04/03/17 1915 04/03/17 1930  BP: 136/82 138/79 (!) 149/88 136/74  Pulse: 72 73 70 65  Resp: 15 17 16 13   Temp:      TempSrc:      SpO2: 97% 97% 96% 95%  Weight:      Height: 5' 10.47" (1.79 m)      Eyes: PERRL, lids and conjunctivae normal ENMT: Mucous membranes are moist. Posterior pharynx clear of any exudate or lesions. Neck: normal, supple, no masses, no thyromegaly Respiratory: clear to auscultation  bilaterally, no wheezing, no crackles. Normal respiratory effort. No accessory muscle use.  Cardiovascular: Regular rate and rhythm, no murmurs / rubs / gallops. No extremity edema. 2+ pedal pulses. No carotid bruits.  Abdomen: Soft, mild diffuse tenderness, no guarding/rebound/masses palpated. No hepatosplenomegaly. Bowel sounds positive.  Musculoskeletal: no clubbing / cyanosis. Good ROM, no contractures. Normal muscle tone.  Skin: no rashes, lesions, ulcers. No induration Neurologic: CN 2-12 grossly intact. Sensation intact, DTR normal. Strength 5/5 in all 4.  Psychiatric: Normal judgment and insight. Alert and oriented x 3. Normal mood.    Labs on Admission: I have personally reviewed following labs and imaging studies  CBC:  Recent Labs Lab 04/03/17 1355  WBC 10.5  HGB 15.2  HCT 44.5  MCV 89.5  PLT 323   Basic Metabolic Panel:  Recent Labs Lab 04/03/17 1355  NA 137  K 4.2  CL 106  CO2 21*  GLUCOSE 141*  BUN 23*  CREATININE 0.84  CALCIUM 9.3   GFR: Estimated Creatinine Clearance: 105.1 mL/min (by C-G formula based on SCr of 0.84 mg/dL). Liver Function Tests:  Recent Labs Lab 04/03/17 1355  AST 25  ALT 28  ALKPHOS 81  BILITOT 0.7  PROT 6.9  ALBUMIN 4.1    Recent Labs Lab 04/03/17 1355  LIPASE 20   No results for input(s): AMMONIA in the last 168 hours. Coagulation Profile: No results for input(s): INR, PROTIME in the last 168 hours. Cardiac Enzymes:  Recent Labs Lab 04/03/17 1815  TROPONINI <0.03   BNP (last 3 results) No results for input(s): PROBNP in the last 8760 hours. HbA1C: No results for input(s): HGBA1C in the last 72 hours. CBG: No results for input(s): GLUCAP in the last 168 hours. Lipid Profile: No results for input(s): CHOL, HDL, LDLCALC, TRIG, CHOLHDL, LDLDIRECT in the last 72 hours. Thyroid Function Tests: No results for input(s): TSH, T4TOTAL, FREET4, T3FREE, THYROIDAB in the last 72 hours. Anemia Panel: No results for  input(s): VITAMINB12, FOLATE, FERRITIN, TIBC, IRON, RETICCTPCT in the last 72 hours. Urine analysis: No results found for: COLORURINE, APPEARANCEUR, LABSPEC, [place], GLUCOSEU, HGBUR, BILIRUBINUR, KETONESUR, PROTEINUR, UROBILINOGEN, NITRITE, LEUKOCYTESUR  Radiological Exams on Admission: Dg Chest Portable 1 View  Result Date: 04/03/2017 CLINICAL DATA:  Generalized chest pain with bilateral arm numbness as well as abdominal pain. EXAM: PORTABLE CHEST 1 VIEW COMPARISON:  Chest x-ray of August 31st 2009 FINDINGS: The lungs are adequately inflated and clear. The heart and pulmonary vascularity are normal. The mediastinum is normal in width. There is no pleural effusion. There is calcification in the wall of the aortic arch. The observed bony structures are unremarkable. IMPRESSION: There is no acute cardiopulmonary abnormality. Thoracic aortic atherosclerosis. Electronically Signed   By: Sophiarose Eades  [place] M.D.   On: 04/03/2017 14:05   Ct Angio Chest/abd/pel For Dissection W And/or Wo Contrast  Result Date: 04/03/2017 CLINICAL DATA:  66 year old male with pain all over and tingling in the arms. EXAM: CT ANGIOGRAPHY CHEST, ABDOMEN AND PELVIS TECHNIQUE: Multidetector CT imaging through the chest, abdomen and pelvis was performed using the standard protocol during bolus administration of intravenous contrast. Multiplanar reconstructed images and MIPs were obtained and reviewed to evaluate the vascular anatomy. Before administration of the IV contrast, a noncontrast chest CT was acquired. CONTRAST:  100 cc Isovue 370 COMPARISON:  Chest x-ray 04/03/2017 FINDINGS: CTA CHEST FINDINGS Cardiovascular: Heart: No cardiomegaly. No pericardial fluid/thickening. Calcifications of left main, left anterior descending, circumflex, right coronary arteries. Aorta: Diameter of ascending aorta measures 3.2 cm. No dissection. No periaortic fluid. Mild atherosclerotic changes of the aortic arch. Branch vessels are patent. Three vessel  arch. Mild atherosclerotic changes of the descending thoracic aorta. No aneurysm. Pulmonary arteries: Timing of the contrast bolus not optimized for evaluation of pulmonary arteries, however, no lobar, segmental, or proximal subsegmental filling defects. Mediastinum/Nodes: Multiple calcified mediastinal lymph nodes, predominantly subcarinal in in the right hilum. Additional lymph nodes throughout all nodal stations which are not enlarged by CT size criteria. No comparison available. Unremarkable course of the esophagus.  Small hiatal hernia. Lungs/Pleura: Calcified granuloma at the right lung base. Atelectasis/scarring at the bilateral dependent lung bases. No confluent airspace disease. No pneumothorax. No pleural effusion. No pneumothorax. Musculoskeletal: No displaced fracture. Degenerative changes of the spine.  Review of the MIP images confirms the above findings. CTA ABDOMEN AND PELVIS FINDINGS VASCULAR Aorta: No dissection. No aneurysm. No periaortic fluid. Atherosclerotic changes of the abdominal aorta with mixed calcified and soft plaque. Celiac: No significant atherosclerotic changes at the origin of the celiac artery. Typical branch pattern. SMA: Atherosclerotic changes superior mesenteric artery without significant stenosis. Renals: Bilateral renal arteries with mild atherosclerotic changes and no significant stenosis. IMA: Inferior mesenteric artery is patent. Left colic artery patent. Superior rectal artery patent. Right lower extremity: Mild atherosclerotic changes of the right iliac system without aneurysm, dissection, or occlusion. External iliac artery patent. Hypogastric artery patent. Proximal right femoral vasculature patent. Left lower extremity: Atherosclerotic changes of the left iliac system without stenosis, occlusion, dissection, or aneurysm. Hypogastric artery is patent. Left external iliac artery patent. Proximal femoral vasculature patent. Veins: Unremarkable appearance of the venous  system. Review of the MIP images confirms the above findings. NON-VASCULAR Hepatobiliary: Unremarkable appearance of the liver. Unremarkable gall bladder. Pancreas: Unremarkable appearance of the pancreas. No pericholecystic fluid or inflammatory changes. Unremarkable ductal system. Spleen: Punctate calcifications within splenic parenchyma. Adrenals/Urinary Tract: Unremarkable appearance of adrenal glands. Right: No hydronephrosis. Symmetric perfusion to the left. No nephrolithiasis. Unremarkable course of the right ureter. Left: No hydronephrosis. Symmetric perfusion to the right. No nephrolithiasis. Unremarkable course of the left ureter. Circumferential bladder wall thickening.  No inflammatory changes. Stomach/Bowel: Unremarkable appearance of the stomach. Unremarkable appearance of small bowel. No evidence of obstruction. Colonic diverticular without evidence of acute diverticulitis. Normal appendix. Lymphatic: Multiple lymph nodes in the para-aortic nodal station, none of which are enlarged. Mesenteric: No free fluid or air. No adenopathy. Reproductive: Transverse diameter of the prostate measures 6.1 cm. Other: No hernia. Musculoskeletal: No acute fracture. Multilevel degenerative changes of the spine. No bony canal narrowing. Vacuum disc phenomenon at L1-L2 and L2-L3. Advanced degenerative disc disease at L5-S1. Mild bilateral degenerative changes of the hips. IMPRESSION: No CT evidence of acute aortic syndrome. No acute finding to account for the patient's symptoms. Enlarged prostate, with circumferential bladder wall thickening, potentially representing chronic bladder outlet obstruction. Aortic atherosclerosis, as well as left main and 3 vessel coronary artery disease. Evidence of prior granulomatous disease. Small hiatal hernia. Diverticular disease without evidence of acute diverticulitis. Signed, Dulcy Fanny. Earleen [place], DO [facility] Radiology Electronically  Signed   By: Corrie Mckusick D.O.   On: 04/03/2017 16:05   09/06/2014 echocardiogram  ------------------------------------------------------------------- LV EF: 60% -  65%  ------------------------------------------------------------------- Indications:   Cardiomyopathy - ischemic 414.8. MI - acute 410.91.  ------------------------------------------------------------------- History:  PMH:  Coronary artery disease.  ------------------------------------------------------------------- Study Conclusions  - Left ventricle: Normal global longitudinal strain -18.8 %. Mildly decreased laterla S prime 8 cm/sec, The cavity size was mildly dilated. Systolic function was normal. The estimated ejection fraction was in the range of 60% to 65%. Wall motion was normal; there were no regional wall motion abnormalities. Features are consistent with a pseudonormal left ventricular filling pattern, with concomitant abnormal relaxation and increased filling pressure (grade 2 diastolic dysfunction). There was no evidence of elevated ventricular filling pressure by Doppler parameters. - Aortic valve: There was mild regurgitation. - Mitral valve: Structurally normal valve. There was mild regurgitation. - Left atrium: The atrium was moderately dilated. - Right ventricle: Systolic function was normal. - Right atrium: The atrium was normal in size. - Pulmonic valve: There was no regurgitation. - Pericardium, extracardiac: There was no pericardial effusion.  Impressions:  - Mildly dilated left ventricle  with normal systolic function and grade 2 diastolic dysfunction with normal filling pressures. Normal RV size and systolic function. Moderate left atrial dilatation. Mild mitral regurgitation.  EKG: Independently reviewed Vent. rate 53 BPM PR interval * ms QRS duration 121 ms QT/QTc 486/457 ms P-R-T axes 34 50 3 Sinus rhythm Nonspecific intraventricular  conduction delay  Assessment/Plan Principal Problem:   Chest pain Admit to telemetry/observation. Supplemental oxygen as needed. Sublingual nitroglycerin when necessary. Trend troponin levels. Continue heparin infusion per cardiology. Keep NPO. The patient is a schedule for cardiac catheterization in the morning.  Active Problems:   CAD (coronary artery disease) As above. See cardiology consult notes. Continue aspirin, beta blocker and statin.    Hyperlipidemia Continue atorvastatin 80 mg by mouth daily. Monitor fasting lipid panel and LFTs per recommended interval.    Hypertension Continue lisinopril 5 mg by mouth daily. Continue metoprolol ER 50 mg by mouth daily. Monitor blood pressure periodically.    Anxiety Continue Celexa 40 mg by mouth daily. Continue Wellbutrin 300 mg by mouth daily.    Attention deficit disorder (ADD) in adult Continue Metadate CD 40 mg po daily.    Actinic keratosis treated with topical fluorouracil (5FU) Continue topical cream.    DVT prophylaxis: On heparin infusion. Code Status: Full code. Family Communication:  Disposition Plan: Admitted for troponin levels trending and cardiac catheterization in the morning. Consults called: Cardiology Admission status: Observation/telemetry.   Reubin Milan MD [facility] Pager [phone].  If 7PM-7AM, please contact night-coverage [email or web address] Password [facility]  04/03/2017, 7:54 PM

## 2017-04-03 NOTE — ED Triage Notes (Signed)
Pt presents via [facility] for evaluation of L sided CP with radiation to right and left arm. Pt reports worsening generalized abd pain with dry heaving x 3-4 days. Pt reports all symptoms worsened this AM, reports hx of MI 2 years ago with similar symptoms. Pt reports took daily meds this AM. Pt given 2 nitro and 324 ASA PTA.

## 2017-04-03 NOTE — Consult Note (Signed)
Admit date: 04/03/2017 Referring Physician: Dr. Tyrone Nine PCP: Dr. Azalia Bilis Primary Cardiologist: Dr. Loralie Champagne Chief complaint/reason for admission:chest pain  HPI: Kenneth Henderson is a 66 y.o. male who is being seen today for the evaluation of chest pain at the request of Dr. Tyrone Nine.  He has a history of CAD with cath 06/2014 in setting of inferior MI showing occluded RCA and underwent PCI with DES to the RCA.  EF at that time was 40-45% and echo 9/15 showed EF 60-65% with normal RV in [place]. Has been off Plavix since 01/10/16.  He says that he started having chest pain about 3-4 days ago that was waxing and waning associated with bilateral arm pain.  He initially tried to justify the arm numbness as being due to his scoliosis.  He has had intermittent arm numbness in the past that would go away when he would shake his arms out.  He also has had jaw pain that he tried to attribute to dental surgery that he had and would improve with Aleve.  This am his symptoms got worse associated with bilateral arm numbness and nausea with dry heaves.  He says that his abdomen felt tight as well.  He has been under a lot of stress over the past few days.  He denies any SOB but has had some mild diaphoresis over the past 4 days. He took rolaids this am but then took SL NTG that improved his  Pain which was subsequently relieved with MSO4.  He was also given an antiemetic that resolved his nausea.  He says that after the NTG wore off his symptoms started to come back but after the MSO4 the pain is gone.       PMH:    Past Medical History:  Diagnosis Date  . Coronary artery disease     CAD with cath 06/2014 in setting of inferior MI showing occluded RCA and underwent PCI with DES to the RCA.  EF at that time was 40-45% and echo 9/15 showed EF 60-65% with normal RV in [place]  . Hyperlipidemia LDL goal <70   . Hypertension     PSH:    Past Surgical History:  Procedure Laterality Date  . BACK SURGERY    .  CORONARY ANGIOPLASTY WITH STENT PLACEMENT    . DENTAL SURGERY    . HERNIA REPAIR      ALLERGIES:   Patient has no known allergies.  Prior to Admit Meds:   (Not in a hospital admission) Family HX:    Family History  Problem Relation Age of Onset  . Heart failure Mother   . Alzheimer's disease Father   . Cancer Sister   . Heart attack Brother 26  . CAD Brother   . Melanoma Brother   . CAD Brother   . Healthy Brother    Social HX:    Social History   Social History  . Marital status: Married    Spouse name: N/A  . Number of children: N/A  . Years of education: N/A   Occupational History  . Not on file.   Social History Main Topics  . Smoking status: Former Research scientist (life sciences)  . Smokeless tobacco: Never Used  . Alcohol use No  . Drug use: No  . Sexual activity: Not on file   Other Topics Concern  . Not on file   Social History Narrative  . No narrative on file     ROS:  All ROS were addressed and are  negative except what is stated in the HPI  PHYSICAL EXAM Vitals:   04/03/17 1645 04/03/17 1715  BP: 122/72 123/69  Pulse: 78 71  Resp: 18 18  Temp:     General: Well developed, well nourished, in no acute distress Head: Eyes PERRLA, No xanthomas.   Normal cephalic and atramatic  Lungs:   Clear bilaterally to auscultation and percussion. Heart:   HRRR S1 S2 Pulses are 2+ & equal.            No carotid bruit. No JVD.  No abdominal bruits. No femoral bruits. Abdomen: Bowel sounds are positive, abdomen soft and non-tender without masses  Msk:  Back normal, normal gait. Normal strength and tone for age. Extremities:   No clubbing, cyanosis or edema.  DP +1 Neuro: Alert and oriented X 3. Psych:  Good affect, responds appropriately   Labs:   Lab Results  Component Value Date   WBC 10.5 04/03/2017   HGB 15.2 04/03/2017   HCT 44.5 04/03/2017   MCV 89.5 04/03/2017   PLT 240 04/03/2017     Recent Labs Lab 04/03/17 1355  NA 137  K 4.2  CL 106  CO2 21*  BUN 23*    CREATININE 0.84  CALCIUM 9.3  PROT 6.9  BILITOT 0.7  ALKPHOS 81  ALT 28  AST 25  GLUCOSE 141*   No results found for: CKTOTAL, CKMB, CKMBINDEX, TROPONINI No results found for: PTT No results found for: INR, PROTIME   Lab Results  Component Value Date   CHOL 128 09/06/2014   Lab Results  Component Value Date   HDL 31 (L) 09/06/2014   Lab Results  Component Value Date   LDLCALC 78 09/06/2014   Lab Results  Component Value Date   TRIG 97 09/06/2014   Lab Results  Component Value Date   CHOLHDL 4.1 09/06/2014   No results found for: LDLDIRECT    Radiology:  Dg Chest Portable 1 View  Result Date: 04/03/2017 CLINICAL DATA:  Generalized chest pain with bilateral arm numbness as well as abdominal pain. EXAM: PORTABLE CHEST 1 VIEW COMPARISON:  Chest x-ray of August 31st 2009 FINDINGS: The lungs are adequately inflated and clear. The heart and pulmonary vascularity are normal. The mediastinum is normal in width. There is no pleural effusion. There is calcification in the wall of the aortic arch. The observed bony structures are unremarkable. IMPRESSION: There is no acute cardiopulmonary abnormality. Thoracic aortic atherosclerosis. Electronically Signed   By: David  Martinique M.D.   On: 04/03/2017 14:05   Ct Angio Chest/abd/pel For Dissection W And/or Wo Contrast  Result Date: 04/03/2017 CLINICAL DATA:  66 year old male with pain all over and tingling in the arms. EXAM: CT ANGIOGRAPHY CHEST, ABDOMEN AND PELVIS TECHNIQUE: Multidetector CT imaging through the chest, abdomen and pelvis was performed using the standard protocol during bolus administration of intravenous contrast. Multiplanar reconstructed images and MIPs were obtained and reviewed to evaluate the vascular anatomy. Before administration of the IV contrast, a noncontrast chest CT was acquired. CONTRAST:  100 cc Isovue 370 COMPARISON:  Chest x-ray 04/03/2017 FINDINGS: CTA CHEST FINDINGS Cardiovascular: Heart: No cardiomegaly.  No pericardial fluid/thickening. Calcifications of left main, left anterior descending, circumflex, right coronary arteries. Aorta: Diameter of ascending aorta measures 3.2 cm. No dissection. No periaortic fluid. Mild atherosclerotic changes of the aortic arch. Branch vessels are patent. Three vessel arch. Mild atherosclerotic changes of the descending thoracic aorta. No aneurysm. Pulmonary arteries: Timing of the contrast bolus not optimized for  evaluation of pulmonary arteries, however, no lobar, segmental, or proximal subsegmental filling defects. Mediastinum/Nodes: Multiple calcified mediastinal lymph nodes, predominantly subcarinal in in the right hilum. Additional lymph nodes throughout all nodal stations which are not enlarged by CT size criteria. No comparison available. Unremarkable course of the esophagus.  Small hiatal hernia. Lungs/Pleura: Calcified granuloma at the right lung base. Atelectasis/scarring at the bilateral dependent lung bases. No confluent airspace disease. No pneumothorax. No pleural effusion. No pneumothorax. Musculoskeletal: No displaced fracture. Degenerative changes of the spine. Review of the MIP images confirms the above findings. CTA ABDOMEN AND PELVIS FINDINGS VASCULAR Aorta: No dissection. No aneurysm. No periaortic fluid. Atherosclerotic changes of the abdominal aorta with mixed calcified and soft plaque. Celiac: No significant atherosclerotic changes at the origin of the celiac artery. Typical branch pattern. SMA: Atherosclerotic changes superior mesenteric artery without significant stenosis. Renals: Bilateral renal arteries with mild atherosclerotic changes and no significant stenosis. IMA: Inferior mesenteric artery is patent. Left colic artery patent. Superior rectal artery patent. Right lower extremity: Mild atherosclerotic changes of the right iliac system without aneurysm, dissection, or occlusion. External iliac artery patent. Hypogastric artery patent. Proximal right  femoral vasculature patent. Left lower extremity: Atherosclerotic changes of the left iliac system without stenosis, occlusion, dissection, or aneurysm. Hypogastric artery is patent. Left external iliac artery patent. Proximal femoral vasculature patent. Veins: Unremarkable appearance of the venous system. Review of the MIP images confirms the above findings. NON-VASCULAR Hepatobiliary: Unremarkable appearance of the liver. Unremarkable gall bladder. Pancreas: Unremarkable appearance of the pancreas. No pericholecystic fluid or inflammatory changes. Unremarkable ductal system. Spleen: Punctate calcifications within splenic parenchyma. Adrenals/Urinary Tract: Unremarkable appearance of adrenal glands. Right: No hydronephrosis. Symmetric perfusion to the left. No nephrolithiasis. Unremarkable course of the right ureter. Left: No hydronephrosis. Symmetric perfusion to the right. No nephrolithiasis. Unremarkable course of the left ureter. Circumferential bladder wall thickening.  No inflammatory changes. Stomach/Bowel: Unremarkable appearance of the stomach. Unremarkable appearance of small bowel. No evidence of obstruction. Colonic diverticular without evidence of acute diverticulitis. Normal appendix. Lymphatic: Multiple lymph nodes in the para-aortic nodal station, none of which are enlarged. Mesenteric: No free fluid or air. No adenopathy. Reproductive: Transverse diameter of the prostate measures 6.1 cm. Other: No hernia. Musculoskeletal: No acute fracture. Multilevel degenerative changes of the spine. No bony canal narrowing. Vacuum disc phenomenon at L1-L2 and L2-L3. Advanced degenerative disc disease at L5-S1. Mild bilateral degenerative changes of the hips. IMPRESSION: No CT evidence of acute aortic syndrome. No acute finding to account for the patient's symptoms. Enlarged prostate, with circumferential bladder wall thickening, potentially representing chronic bladder outlet obstruction. Aortic atherosclerosis,  as well as left main and 3 vessel coronary artery disease. Evidence of prior granulomatous disease. Small hiatal hernia. Diverticular disease without evidence of acute diverticulitis. Signed, Dulcy Fanny. Earleen Newport, DO [facility] Radiology Electronically Signed   By: Corrie Mckusick D.O.   On: 04/03/2017 16:05     Telemetry    NSR with no ST changes - Personally Reviewed  ECG    NSR - Personally Reviewed   ASSESSMENT:PLAN:   1.  Unstable angina - symptoms are somewhat atypical but similar to his symptoms with his MI.  He has had bilateral arm numbness that comes and goes and has noted this with his scoliosis in the past but usually can shake his arms to make it go away but did not try that this time.  He has had pain across his chest associated with dry heaves  for 4 days fairly constant with waxing and waning despite normal troponin.  The pain improved some with NTG but really resolved after MSO4.  He also has GERD and has had some abdominal pain as well so symptoms could be related to GERD.  EKG is nonischemic.  He apparently had a normal nuclear stress test a few years back and then had an inferior MI requiring a stent which "cost a lot of money" and he is convinced that his stress test was read wrong and is adamant that he will not had another stress test.  He wants a cath.  Given the similarity of his symptoms with prior MI, nausea, diaphoresis and bilateral arm numbness, will make NPO after MN for cath in the am.  Cardiac catheterization was discussed with the patient fully. The patient understands that risks include but are not limited to stroke (1 in 1000), death (1 in 62), kidney failure [usually temporary] (1 in 500), bleeding (1 in 200), allergic reaction [possibly serious] (1 in 200).  The patient understands and is willing to proceed.  Will continue IV heparin gtt and ASA.  Continue statin and BB.    2.  ASCAD with remote inferior MI s/p PCI of  occluded RCA with DES.  He has been off plavix for a year.   3.  HTN - BP borderline.  Continue BB and ACE I and adjust as needed for BP control.    4.  Hyperlipidemia on statin.  Check FLP and ALT in am.     Fransico Him, MD  04/03/2017  5:49 PM

## 2017-04-03 NOTE — Progress Notes (Signed)
ANTICOAGULATION CONSULT NOTE - Initial Consult  Pharmacy Consult for Heparin Indication: chest pain/ACS  No Known Allergies  Patient Measurements: Height: 5' 10.47" (179 cm) Weight: 235 lb (106.6 kg) IBW/kg (Calculated) : 74.09 Heparin Dosing Weight: 97kg  Vital Signs: Temp: 98 F (36.7 C) (04/12 1350) Temp Source: Oral (04/12 1350) BP: 136/82 (04/12 1800) Pulse Rate: 72 (04/12 1800)  Labs:  Recent Labs  04/03/17 1355  HGB 15.2  HCT 44.5  PLT 240  CREATININE 0.84    Estimated Creatinine Clearance: 105.1 mL/min (by C-G formula based on SCr of 0.84 mg/dL).   Medical History: Past Medical History:  Diagnosis Date  . Coronary artery disease     CAD with cath 06/2014 in setting of inferior MI showing occluded RCA and underwent PCI with DES to the RCA.  EF at that time was 40-45% and echo 9/15 showed EF 60-65% with normal RV in [place]  . Hyperlipidemia LDL goal <70   . Hypertension    Assessment: 65yom to begin heparin for chest pain. No anticoagulants pta.   Goal of Therapy:  Heparin level 0.3-0.7 units/ml Monitor platelets by anticoagulation protocol: Yes   Plan:  1) Heparin bolus 4000 units x 1 2) Heparin drip at 1350 units/hr 3) 6 hour heparin level 4) Daily heparin level and CBC  Deboraha Sprang 04/03/2017,6:15 PM

## 2017-04-03 NOTE — Consult Note (Signed)
Cardiology Consult    Patient ID: Kenneth Henderson MRN: [id number], DOB/AGE: 68-May-1952   Admit date: 04/03/2017 Date of Consult: 04/03/2017  Primary Physician: Shirline Frees, MD Reason for Consult: Chest pain Primary Cardiologist: Dr. Aundra Dubin Requesting Provider: Dr. Tyrone Nine  History of Present Illness    Kenneth Henderson is a 66 y.o. male who is being seen today for the evaluation of chest pain at the request of Dr. Tyrone Nine.  The patient has a past medical history significant for CAD status post inferior MI with DES to RCA 06/2014, hyperlipidemia, hypertension and GERD.  The patient had been feeling well without exertional chest discomfort or shortness of breath until recently. He has been under a lot of stress for the last 10 days and had recent dental surgery. His dental pain led him to take Aleve 4 pills in the morning and 3 in the evening for about the last week. Was given antibiotics for his jaw pain and potential infection. He has had upper abdominal pain for the past several days and today he had more severe abdominal pain and mild diaphoresis and he noticed numbness of the arms. He took nitroglycerin which relieved the numbness and this reminded him of the symptoms surrounding his MI in 2015. This prompted him to come to the emergency department.  He quit smoking 30 years ago and drinks an occasional glass of wine. He has family history significant for cardiovascular disease with one brother dying of an MI at age 73 and having his first MI at age 1 and a two brothers with CAD.  First troponin is negative. Normal kidney function and electrolytes. Chest x-ray without acute findings.  CT of the abdomen and chest did not show acute findings that would account for current symptoms, however there is aortic atherosclerosis as well as left main and three-vessel coronary artery disease.  He was last seen in the office on 01/15/2016 for preop clearance for back surgery at which time he was doing  well.  Cardiac catheterization 07/15/2014; 100% stenosed mid RCA, mid LAD with 10-30% stenosis. DES placed to RCA and patient placed on Plavix.  Echocardiogram in 2015 showed mildly dilated left ventricle with normal systolic function with EF 60-65% and grade 2 diastolic dysfunction with normal filling pressures and mild mitral regurgitation.  Past Medical History   Past Medical History:  Diagnosis Date  . Coronary artery disease   . Hypertension     Past Surgical History:  Procedure Laterality Date  . BACK SURGERY    . CORONARY ANGIOPLASTY WITH STENT PLACEMENT    . DENTAL SURGERY    . HERNIA REPAIR       Allergies  No Known Allergies  Inpatient Medications      Family History    Family History  Problem Relation Age of Onset  . Heart failure Mother   . Alzheimer's disease Father   . Cancer Sister   . Heart attack Brother 42  . CAD Brother   . Melanoma Brother   . CAD Brother   . Healthy Brother      Social History    Social History   Social History  . Marital status: Married    Spouse name: N/A  . Number of children: N/A  . Years of education: N/A   Occupational History  . Not on file.   Social History Main Topics  . Smoking status: Former Research scientist (life sciences)  . Smokeless tobacco: Never Used  . Alcohol use No  . Drug use: No  .  Sexual activity: Not on file   Other Topics Concern  . Not on file   Social History Narrative  . No narrative on file     Review of Systems    General:  No chills, fever, night sweats or weight changes.  Cardiovascular:  No chest pain, dyspnea on exertion, edema, orthopnea, palpitations, paroxysmal nocturnal dyspnea. Dermatological: No rash, lesions/masses Respiratory: No cough, dyspnea Urologic: No hematuria, dysuria Abdominal:   Positive for upper abdominal pain and nausea with dry heaves No diarrhea, bright red blood per rectum, melena, or hematemesis Neurologic:  No visual changes, wkns, changes in mental status. All other  systems reviewed and are otherwise negative except as noted above.  Physical Exam    Blood pressure 123/69, pulse 71, temperature 98 F (36.7 C), temperature source Oral, resp. rate 18, weight 235 lb (106.6 kg), SpO2 95 %.  General: Pleasant, NAD Psych: Normal affect. Neuro: Alert and oriented X 3. Moves all extremities spontaneously. HEENT: Normal  Neck: Supple without bruits or JVD. Lungs:  Resp regular and unlabored, CTA. Heart: RRR, 1/6 systolic murmur in the left mid chest area Abdomen: Soft, no epigastric pain but tenderness noted in right upper quadrant on palpation, non-distended, BS + x 4.  Extremities: No clubbing, cyanosis or edema. DP/PT/Radials 2+ and equal bilaterally.  Labs    Troponin (Point of Care Test)  Recent Labs  04/03/17 1400  TROPIPOC 0.00   No results for input(s): CKTOTAL, CKMB, TROPONINI in the last 72 hours. Lab Results  Component Value Date   WBC 10.5 04/03/2017   HGB 15.2 04/03/2017   HCT 44.5 04/03/2017   MCV 89.5 04/03/2017   PLT 240 04/03/2017     Recent Labs Lab 04/03/17 1355  NA 137  K 4.2  CL 106  CO2 21*  BUN 23*  CREATININE 0.84  CALCIUM 9.3  PROT 6.9  BILITOT 0.7  ALKPHOS 81  ALT 28  AST 25  GLUCOSE 141*   Lab Results  Component Value Date   CHOL 128 09/06/2014   HDL 31 (L) 09/06/2014   LDLCALC 78 09/06/2014   TRIG 97 09/06/2014   No results found for: [facility]   Radiology Studies    Dg Chest Portable 1 View  Result Date: 04/03/2017 CLINICAL DATA:  Generalized chest pain with bilateral arm numbness as well as abdominal pain. EXAM: PORTABLE CHEST 1 VIEW COMPARISON:  Chest x-ray of August 31st 2009 FINDINGS: The lungs are adequately inflated and clear. The heart and pulmonary vascularity are normal. The mediastinum is normal in width. There is no pleural effusion. There is calcification in the wall of the aortic arch. The observed bony structures are unremarkable. IMPRESSION: There is no acute cardiopulmonary  abnormality. Thoracic aortic atherosclerosis. Electronically Signed   By: David  Martinique M.D.   On: 04/03/2017 14:05   Ct Angio Chest/abd/pel For Dissection W And/or Wo Contrast  Result Date: 04/03/2017 CLINICAL DATA:  66 year old male with pain all over and tingling in the arms. EXAM: CT ANGIOGRAPHY CHEST, ABDOMEN AND PELVIS TECHNIQUE: Multidetector CT imaging through the chest, abdomen and pelvis was performed using the standard protocol during bolus administration of intravenous contrast. Multiplanar reconstructed images and MIPs were obtained and reviewed to evaluate the vascular anatomy. Before administration of the IV contrast, a noncontrast chest CT was acquired. CONTRAST:  100 cc Isovue 370 COMPARISON:  Chest x-ray 04/03/2017 FINDINGS: CTA CHEST FINDINGS Cardiovascular: Heart: No cardiomegaly. No pericardial fluid/thickening. Calcifications of left main, left anterior descending, circumflex, right coronary arteries.  Aorta: Diameter of ascending aorta measures 3.2 cm. No dissection. No periaortic fluid. Mild atherosclerotic changes of the aortic arch. Branch vessels are patent. Three vessel arch. Mild atherosclerotic changes of the descending thoracic aorta. No aneurysm. Pulmonary arteries: Timing of the contrast bolus not optimized for evaluation of pulmonary arteries, however, no lobar, segmental, or proximal subsegmental filling defects. Mediastinum/Nodes: Multiple calcified mediastinal lymph nodes, predominantly subcarinal in in the right hilum. Additional lymph nodes throughout all nodal stations which are not enlarged by CT size criteria. No comparison available. Unremarkable course of the esophagus.  Small hiatal hernia. Lungs/Pleura: Calcified granuloma at the right lung base. Atelectasis/scarring at the bilateral dependent lung bases. No confluent airspace disease. No pneumothorax. No pleural effusion. No pneumothorax. Musculoskeletal: No displaced fracture. Degenerative changes of the spine.  Review of the MIP images confirms the above findings. CTA ABDOMEN AND PELVIS FINDINGS VASCULAR Aorta: No dissection. No aneurysm. No periaortic fluid. Atherosclerotic changes of the abdominal aorta with mixed calcified and soft plaque. Celiac: No significant atherosclerotic changes at the origin of the celiac artery. Typical branch pattern. SMA: Atherosclerotic changes superior mesenteric artery without significant stenosis. Renals: Bilateral renal arteries with mild atherosclerotic changes and no significant stenosis. IMA: Inferior mesenteric artery is patent. Left colic artery patent. Superior rectal artery patent. Right lower extremity: Mild atherosclerotic changes of the right iliac system without aneurysm, dissection, or occlusion. External iliac artery patent. Hypogastric artery patent. Proximal right femoral vasculature patent. Left lower extremity: Atherosclerotic changes of the left iliac system without stenosis, occlusion, dissection, or aneurysm. Hypogastric artery is patent. Left external iliac artery patent. Proximal femoral vasculature patent. Veins: Unremarkable appearance of the venous system. Review of the MIP images confirms the above findings. NON-VASCULAR Hepatobiliary: Unremarkable appearance of the liver. Unremarkable gall bladder. Pancreas: Unremarkable appearance of the pancreas. No pericholecystic fluid or inflammatory changes. Unremarkable ductal system. Spleen: Punctate calcifications within splenic parenchyma. Adrenals/Urinary Tract: Unremarkable appearance of adrenal glands. Right: No hydronephrosis. Symmetric perfusion to the left. No nephrolithiasis. Unremarkable course of the right ureter. Left: No hydronephrosis. Symmetric perfusion to the right. No nephrolithiasis. Unremarkable course of the left ureter. Circumferential bladder wall thickening.  No inflammatory changes. Stomach/Bowel: Unremarkable appearance of the stomach. Unremarkable appearance of small bowel. No evidence of  obstruction. Colonic diverticular without evidence of acute diverticulitis. Normal appendix. Lymphatic: Multiple lymph nodes in the para-aortic nodal station, none of which are enlarged. Mesenteric: No free fluid or air. No adenopathy. Reproductive: Transverse diameter of the prostate measures 6.1 cm. Other: No hernia. Musculoskeletal: No acute fracture. Multilevel degenerative changes of the spine. No bony canal narrowing. Vacuum disc phenomenon at L1-L2 and L2-L3. Advanced degenerative disc disease at L5-S1. Mild bilateral degenerative changes of the hips. IMPRESSION: No CT evidence of acute aortic syndrome. No acute finding to account for the patient's symptoms. Enlarged prostate, with circumferential bladder wall thickening, potentially representing chronic bladder outlet obstruction. Aortic atherosclerosis, as well as left main and 3 vessel coronary artery disease. Evidence of prior granulomatous disease. Small hiatal hernia. Diverticular disease without evidence of acute diverticulitis. Signed, Dulcy Fanny. Earleen Newport, DO [facility] Radiology Electronically Signed   By: Corrie Mckusick D.O.   On: 04/03/2017 16:05    EKG & Cardiac Imaging    EKG: Sinus bradycardia 53 bpm, Nonspecific intraventricular conduction delay, no acute ischemic changes  Echocardiogram:   Echocardiogram in 2015 showed mildly dilated left ventricle with normal systolic function with EF 60-65% and grade 2 diastolic dysfunction with normal filling pressures  and mild mitral regurgitation.   Assessment & Plan    CAD -Status post inferior MI with DES to RCA in 06/2014, EF 40-45% -Patient is having epigastric and upper abdominal pain but also had some numbness in his hands that was reminiscent of his MI in 2015 and resolved with nitroglycerin in the ED. He has had no exertional chest discomfort or dyspnea. Also having numbness of the arms reminiscent of his previous MI.  -Currently treated  with aspirin, beta blocker, statin. No longer on Plavix -Last echocardiogram in 2015 showed normal EF 23-76%, grade 2 diastolic dysfunction, mild MR -EKG without acute ischemic changes -First troponin is negative. Normal kidney function and electrolytes. Chest x-ray without acute findings.  -CT of the abdomen and chest did not show acute findings that would account for current symptoms, however there is aortic atherosclerosis as well as left main and three-vessel coronary artery disease. No PE. -The patient's discomfort is more abdominal in nature and likely related to GI issues in setting of taking large doses of Aleve after dental surgery. However, some symptoms similar to previous MI. Options discussed with patient and his wife. We'll plan for cardiac catheterization tomorrow for definitive cardiac evaluation.  Hyperlipidemia -Last lipid panel in Epic on 09/06/14: LDL 78, HDL 31, triglycerides 97 -Treated with Atorvastatin 80 mg and omega-3 fatty acids  Hypertension -Treated with lisinopril 5 mg and Toprol XL 50 mg -Blood pressure was high at presentation likely related to pain. Is currently well controlled.  GERD Has a history of reflux since early adulthood. He sleeps with his head of the bed elevated to prevent symptoms -Has been taking Aleve- 4 pills in the morning and 3 in the evening for about the last week for dental pain  -Treated with Nexium 40 mg. Continue PPI.  Tildon Husky, NP-C 04/03/2017, 5:35 PM Pager: [phone]  Attending Note:   The patient was seen and examined.  Agree with assessment and plan as noted above.  Changes made to the above note as needed.  Patient seen and independently examined with Pecolia Ades, NP.   We discussed all aspects of the encounter. I agree with the assessment and plan as stated above.  1. CHest pain ;  Patient has a hx of CAD His symptoms are very similar to his previous episodes of angina.   Seem to be more abdominal  discomfort than CP . CT scan shows coronary calcifications  Will schedule cath tomorrow   2. hyperlipidema:   His lipids are fairly well controlled.   Continue atorva       I have spent a total of 40 minutes with patient reviewing hospital  notes , telemetry, EKGs, labs and examining patient as well as establishing an assessment and plan that was discussed with the patient. > 50% of time was spent in direct patient care.    Thayer Headings, Brooke Bonito., MD, [facility] 04/05/2017, 3:21 PM [address],  [place] Pager [phone]

## 2017-04-04 ENCOUNTER — Encounter (HOSPITAL_COMMUNITY)
Admission: EM | Disposition: A | Discharge status: Home / Self Care | Payer: Self-pay | Source: Home / Self Care | Attending: Emergency Medicine

## 2017-04-04 DIAGNOSIS — R072 Precordial pain: Secondary | ICD-10-CM | POA: Diagnosis not present

## 2017-04-04 DIAGNOSIS — I209 Angina pectoris, unspecified: Secondary | ICD-10-CM

## 2017-04-04 DIAGNOSIS — I2510 Atherosclerotic heart disease of native coronary artery without angina pectoris: Secondary | ICD-10-CM | POA: Diagnosis not present

## 2017-04-04 DIAGNOSIS — I25110 Atherosclerotic heart disease of native coronary artery with unstable angina pectoris: Secondary | ICD-10-CM | POA: Diagnosis not present

## 2017-04-04 HISTORY — PX: LEFT HEART CATH AND CORONARY ANGIOGRAPHY: CATH118249

## 2017-04-04 LAB — COMPREHENSIVE METABOLIC PANEL
ALT: 23 U/L (ref 17–63)
AST: 18 U/L (ref 15–41)
Albumin: 3.7 g/dL (ref 3.5–5.0)
Alkaline Phosphatase: 77 U/L (ref 38–126)
Anion gap: 8 (ref 5–15)
BUN: 15 mg/dL (ref 6–20)
CO2: 24 mmol/L (ref 22–32)
Calcium: 8.9 mg/dL (ref 8.9–10.3)
Chloride: 104 mmol/L (ref 101–111)
Creatinine, Ser: 0.87 mg/dL (ref 0.61–1.24)
GFR calc Af Amer: >60 mL/min (ref >60)
GFR calc non Af Amer: >60 mL/min (ref >60)
Glucose, Bld: 111 mg/dL — ABNORMAL HIGH (ref 65–99)
Potassium: 4.5 mmol/L (ref 3.5–5.1)
Sodium: 136 mmol/L (ref 135–145)
Total Bilirubin: 0.8 mg/dL (ref 0.3–1.2)
Total Protein: 6.2 g/dL — ABNORMAL LOW (ref 6.5–8.1)

## 2017-04-04 LAB — TROPONIN I
Troponin I: <0.03 ng/mL (ref <0.03)
Troponin I: <0.03 ng/mL (ref <0.03)

## 2017-04-04 LAB — CBC
HCT: 43.5 % (ref 39.0–52.0)
Hemoglobin: 14.3 g/dL (ref 13.0–17.0)
MCH: 29.9 pg (ref 26.0–34.0)
MCHC: 32.9 g/dL (ref 30.0–36.0)
MCV: 91 fL (ref 78.0–100.0)
Platelets: 208 10*3/uL (ref 150–400)
RBC: 4.78 MIL/uL (ref 4.22–5.81)
RDW: 13.4 % (ref 11.5–15.5)
WBC: 12.4 10*3/uL — ABNORMAL HIGH (ref 4.0–10.5)

## 2017-04-04 LAB — PROTIME-INR
INR: 1.01
Prothrombin Time: 13.3 seconds (ref 11.4–15.2)

## 2017-04-04 LAB — HEPARIN LEVEL (UNFRACTIONATED)
Heparin Unfractionated: 0.23 IU/mL — ABNORMAL LOW (ref 0.30–0.70)
Heparin Unfractionated: 0.31 IU/mL (ref 0.30–0.70)

## 2017-04-04 LAB — LIPASE, BLOOD: Lipase: 12 U/L (ref 11–51)

## 2017-04-04 SURGERY — LEFT HEART CATH AND CORONARY ANGIOGRAPHY
Anesthesia: LOCAL

## 2017-04-04 MED ORDER — HEPARIN (PORCINE) IN NACL 2-0.9 UNIT/ML-% IJ SOLN
INTRAMUSCULAR | Status: DC | PRN
  Administered 2017-04-04: 1000 mL

## 2017-04-04 MED ORDER — SODIUM CHLORIDE 0.9 % IV SOLN
INTRAVENOUS | Status: AC
  Administered 2017-04-04: 16:00:00 via INTRAVENOUS

## 2017-04-04 MED ORDER — FENTANYL CITRATE (PF) 100 MCG/2ML IJ SOLN
INTRAMUSCULAR | Status: DC | PRN
  Administered 2017-04-04: 50 ug via INTRAVENOUS

## 2017-04-04 MED ORDER — MIDAZOLAM HCL 2 MG/2ML IJ SOLN
INTRAMUSCULAR | Status: DC | PRN
  Administered 2017-04-04: 1 mg via INTRAVENOUS

## 2017-04-04 MED ORDER — SODIUM CHLORIDE 0.9 % IV SOLN: 250.0000 mL | INTRAVENOUS | Status: DC | PRN

## 2017-04-04 MED ORDER — VERAPAMIL HCL 2.5 MG/ML IV SOLN
INTRAVENOUS | Status: DC | PRN
  Administered 2017-04-04: 10 mL via INTRA_ARTERIAL

## 2017-04-04 MED ORDER — FENTANYL CITRATE (PF) 100 MCG/2ML IJ SOLN
INTRAMUSCULAR | Status: AC
  Filled 2017-04-04: qty 2

## 2017-04-04 MED ORDER — OXYCODONE-ACETAMINOPHEN 5-325 MG PO TABS
1.0000 | ORAL_TABLET | ORAL | Status: DC | PRN
  Administered 2017-04-04: 2 via ORAL
  Filled 2017-04-04: qty 2

## 2017-04-04 MED ORDER — LIDOCAINE HCL (PF) 1 % IJ SOLN
INTRAMUSCULAR | Status: AC
  Filled 2017-04-04: qty 30

## 2017-04-04 MED ORDER — IOPAMIDOL (ISOVUE-370) INJECTION 76%
INTRAVENOUS | Status: AC
  Filled 2017-04-04: qty 50

## 2017-04-04 MED ORDER — MIDAZOLAM HCL 2 MG/2ML IJ SOLN
INTRAMUSCULAR | Status: AC
  Filled 2017-04-04: qty 2

## 2017-04-04 MED ORDER — LIDOCAINE HCL (PF) 1 % IJ SOLN
INTRAMUSCULAR | Status: DC | PRN
  Administered 2017-04-04: 2 mL via INTRADERMAL

## 2017-04-04 MED ORDER — SODIUM CHLORIDE 0.9% FLUSH
3.0000 mL | Freq: Two times a day (BID) | INTRAVENOUS | Status: DC
  Administered 2017-04-04 – 2017-04-05 (×2): 3 mL via INTRAVENOUS

## 2017-04-04 MED ORDER — SODIUM CHLORIDE 0.9% FLUSH: 3.0000 mL | INTRAVENOUS | Status: DC | PRN

## 2017-04-04 MED ORDER — IOPAMIDOL (ISOVUE-370) INJECTION 76%
INTRAVENOUS | Status: DC | PRN
  Administered 2017-04-04: 85 mL via INTRA_ARTERIAL

## 2017-04-04 MED ORDER — HEPARIN (PORCINE) IN NACL 2-0.9 UNIT/ML-% IJ SOLN
INTRAMUSCULAR | Status: AC
  Filled 2017-04-04: qty 1000

## 2017-04-04 MED ORDER — HEPARIN SODIUM (PORCINE) 1000 UNIT/ML IJ SOLN
INTRAMUSCULAR | Status: DC | PRN
  Administered 2017-04-04: 5000 [IU] via INTRAVENOUS

## 2017-04-04 MED ORDER — HEPARIN SODIUM (PORCINE) 1000 UNIT/ML IJ SOLN
INTRAMUSCULAR | Status: AC
  Filled 2017-04-04: qty 1

## 2017-04-04 MED ORDER — CITALOPRAM HYDROBROMIDE 20 MG PO TABS
20.0000 mg | ORAL_TABLET | Freq: Every day | ORAL | Status: DC
  Administered 2017-04-05: 20 mg via ORAL
  Filled 2017-04-04: qty 1

## 2017-04-04 MED ORDER — HEPARIN SODIUM (PORCINE) 5000 UNIT/ML IJ SOLN
5000.0000 [IU] | Freq: Three times a day (TID) | INTRAMUSCULAR | Status: DC
Start: 2017-04-04 — End: 2017-04-05
  Administered 2017-04-04 – 2017-04-05 (×2): 5000 [IU] via SUBCUTANEOUS
  Filled 2017-04-04 (×3): qty 1

## 2017-04-04 MED ORDER — ACETAMINOPHEN 325 MG PO TABS
650.0000 mg | ORAL_TABLET | ORAL | Status: DC | PRN
  Administered 2017-04-04: 650 mg via ORAL
  Filled 2017-04-04: qty 2

## 2017-04-04 MED ORDER — IOPAMIDOL (ISOVUE-370) INJECTION 76%
INTRAVENOUS | Status: AC
  Filled 2017-04-04: qty 100

## 2017-04-04 MED ORDER — VERAPAMIL HCL 2.5 MG/ML IV SOLN
INTRAVENOUS | Status: AC
  Filled 2017-04-04: qty 2

## 2017-04-04 MED ORDER — ASPIRIN 81 MG PO CHEW
81.0000 mg | CHEWABLE_TABLET | Freq: Every day | ORAL | Status: DC
  Administered 2017-04-05: 81 mg via ORAL
  Filled 2017-04-04: qty 1

## 2017-04-04 SURGICAL SUPPLY — 12 items
CATH INFINITI 5 FR JL3.5 (CATHETERS) ×1 IMPLANT
CATH INFINITI JR4 5F (CATHETERS) ×1 IMPLANT
COVER PRB 48X5XTLSCP FOLD TPE (BAG) IMPLANT
COVER PROBE 5X48 (BAG) ×2
DEVICE RAD COMP TR BAND LRG (VASCULAR PRODUCTS) ×1 IMPLANT
GLIDESHEATH SLEND A-KIT 6F 22G (SHEATH) ×1 IMPLANT
GUIDEWIRE INQWIRE 1.5J.035X260 (WIRE) IMPLANT
INQWIRE 1.5J .035X260CM (WIRE) ×2
KIT HEART LEFT (KITS) ×2 IMPLANT
PACK CARDIAC CATHETERIZATION (CUSTOM PROCEDURE TRAY) ×2 IMPLANT
TRANSDUCER W/STOPCOCK (MISCELLANEOUS) ×2 IMPLANT
TUBING CIL FLEX 10 FLL-RA (TUBING) ×2 IMPLANT

## 2017-04-04 NOTE — Progress Notes (Signed)
ANTICOAGULATION CONSULT NOTE   Pharmacy Consult for Heparin Indication: chest pain/ACS  No Known Allergies  Patient Measurements: Height: 5\' 11"  (180.3 cm) Weight: 232 lb (105.2 kg) IBW/kg (Calculated) : 75.3 Heparin Dosing Weight: 97kg  Vital Signs: Temp: 98.4 F (36.9 C) (04/13 0851) Temp Source: Oral (04/13 0851) BP: 173/72 (04/13 0851) Pulse Rate: 68 (04/13 0851)  Labs:  Recent Labs  04/03/17 1355 04/03/17 1815 04/04/17 0030 04/04/17 0618 04/04/17 0953  HGB 15.2  --   --  14.3  --   HCT 44.5  --   --  43.5  --   PLT 240  --   --  208  --   LABPROT  --   --  13.3  --   --   INR  --   --  1.01  --   --   HEPARINUNFRC  --   --  0.23*  --  0.31  CREATININE 0.84  --   --  0.87  --   TROPONINI  --  <0.03 <0.03 <0.03  --     Estimated Creatinine Clearance: 101.7 mL/min (by C-G formula based on SCr of 0.87 mg/dL).   Medical History: Past Medical History:  Diagnosis Date  . Actinic keratosis treated with topical fluorouracil (5FU)   . Anxiety   . Arthritis    "mostly in my shoulders; spine" (04/03/2017)  . Attention deficit disorder (ADD) in adult   . Basal cell carcinoma   . Coronary artery disease     CAD with cath 06/2014 in setting of inferior MI showing occluded RCA and underwent PCI with DES to the RCA.  EF at that time was 40-45% and echo 9/15 showed EF 60-65% with normal RV in [employer]  . Depression   . GERD (gastroesophageal reflux disease)   . Hyperlipidemia LDL goal <70   . Hypertension   . Inferior MI ([place]) 06/2014   Archie Endo 04/03/2017  . Melanoma of forearm, left ([place])   . Sleep apnea    "never took test or wore mask" (04/03/2017)  . Squamous carcinoma     Assessment: 65yom on heparin for chest pain. Heparin level is at goal at 1500 units/hr. Plans noted for cath today.    Goal of Therapy:  Heparin level 0.3-0.7 units/ml Monitor platelets by anticoagulation protocol: Yes   Plan:  No heparin changes needed Will follow plans post  cath  Hildred Laser, Pharm D 04/04/2017 11:13 AM

## 2017-04-04 NOTE — Care Management Obs Status (Signed)
[place] NOTIFICATION   Patient Details  Name: Kenneth Henderson MRN: [id number] Date of Birth: Dec 14, 1951   Medicare Observation Status Notification Given:  Yes    Dawayne Patricia, RN 04/04/2017, 4:32 PM

## 2017-04-04 NOTE — Progress Notes (Signed)
ANTICOAGULATION CONSULT NOTE - Follow Up Consult  Pharmacy Consult for Heparin  Indication: chest pain/ACS  No Known Allergies  Patient Measurements: Height: 5\' 11"  (180.3 cm) Weight: 232 lb (105.2 kg) IBW/kg (Calculated) : 75.3 Vital Signs: Temp: 98.5 F (36.9 C) (04/12 2007) Temp Source: Oral (04/12 2007) BP: 149/86 (04/12 2007) Pulse Rate: 74 (04/12 2007)  Labs:  Recent Labs  04/03/17 1355 04/03/17 1815 04/04/17 0030  HGB 15.2  --   --   HCT 44.5  --   --   PLT 240  --   --   LABPROT  --   --  13.3  INR  --   --  1.01  HEPARINUNFRC  --   --  0.23*  CREATININE 0.84  --   --   TROPONINI  --  <0.03 <0.03    Estimated Creatinine Clearance: 105.4 mL/min (by C-G formula based on SCr of 0.84 mg/dL).   Assessment: Heparin for CP, cath today, initial heparin level is low, no issues per RN. 3 Goal of Therapy:  Heparin level 0.3-0.7 units/ml Monitor platelets by anticoagulation protocol: Yes   Plan:  -Inc heparin to 1500 units/hr -1000 HL  Jory Tanguma 04/04/2017,1:58 AM

## 2017-04-04 NOTE — Interval H&P Note (Signed)
Cath Lab Visit (complete for each Cath Lab visit)  Clinical Evaluation Leading to the Procedure:   ACS: Yes.    Non-ACS:    Anginal Classification: CCS III  Anti-ischemic medical therapy: No Therapy  Non-Invasive Test Results: No non-invasive testing performed  Prior CABG: No previous CABG      History and Physical Interval Note:  04/04/2017 2:50 PM  Kenneth Henderson  has presented today for surgery, with the diagnosis of unstable angina  The various methods of treatment have been discussed with the patient and family. After consideration of risks, benefits and other options for treatment, the patient has consented to  Procedure(s): Left Heart Cath and Coronary Angiography (N/A) as a surgical intervention .  The patient's history has been reviewed, patient examined, no change in status, stable for surgery.  I have reviewed the patient's chart and labs.  Questions were answered to the patient's satisfaction.     Belva Crome III

## 2017-04-04 NOTE — Progress Notes (Signed)
PROGRESS NOTE    Kenneth Henderson  WPV:[id number] DOB: Apr 02, 1951 DOA: 04/03/2017 PCP: Shirline Frees, MD     Brief Narrative:  Kenneth Henderson is a 66 y.o. male with medical history significant of actinic keratosis, anxiety, ADD, hyperlipidemia, hypertension, CAD, history of inferior MI in July 2015 with 100 % occlusion to the RCA, S/P PCI with DES was coming to the emergency department with complaints of abdominal pain for the past 3-4 days, which is associated today with left-sided chest pain radiates to his left arm and tingling of both hands. These symptoms were similar to the the ones he had in 2015 when he was diagnosed with acute MI. He stated that he took sublingual nitroglycerin which relieved the symptoms. Patient admitted for further work up.   Assessment & Plan:   Principal Problem:   Chest pain Active Problems:   CAD (coronary artery disease)   Hyperlipidemia   Hypertension   Anxiety   Attention deficit disorder (ADD) in adult   Actinic keratosis treated with topical fluorouracil (5FU)  Chest pain  -Cardiology following, heart cath planned for today. -Troponin negative x 3  -Continue heparin infusion  RLQ abdominal pain -Patient complains of RLQ this morning on my exam, although per cardiology note, he had RUQ and LLQ pain. CTA chest/abdomen/pelvis unremarkable for abdominal etiology. LFT unremarkable.  -Continue to monitor, may require further workup if persists  CAD, that is post PCI to DES to the RCA in 2015 -Daily aspirin, lipitor, metoprolol   Hyperlipidemia -Continue Lipitor  Hypertension -Continue lisinopril, metoprolol  Anxiety/depression/ADHD -Continue Celexa, Wellbutrin, metadate. Celexa dose is decreased due to risk for QT prolongation  DVT prophylaxis: heparin gtt Code Status: Full Family Communication: No family at bedside Disposition Plan: Pending further workup, heart cath   Consultants:   Cardiology  Procedures:   None  Antimicrobials:     None    Subjective: Patient complains of abdominal pain today. He also admits to bloating, gas, belching. He describes it as a right lower quadrant pain that comes and goes, relieved with morphine. He has been passing gas, having normal bowel movements without blood, no nausea or vomiting. He also describes chest pain as well as numbness in his arms that is now resolved. Shortness of breath  Objective: Vitals:   04/03/17 2007 04/04/17 0334 04/04/17 0336 04/04/17 0851  BP: (!) 149/86 (!) 161/82 (!) 146/73 (!) 173/72  Pulse: 74 72  68  Resp: 18 18  18   Temp: 98.5 F (36.9 C) 98.6 F (37 C)  98.4 F (36.9 C)  TempSrc: Oral Oral  Oral  SpO2: 96% 95%  96%  Weight: 105.2 kg (232 lb)     Height: 5\' 11"  (1.803 m)       Intake/Output Summary (Last 24 hours) at 04/04/17 1157 Last data filed at 04/04/17 0800  Gross per 24 hour  Intake           323.99 ml  Output                0 ml  Net           323.99 ml   Filed Weights   04/03/17 1350 04/03/17 2007  Weight: 106.6 kg (235 lb) 105.2 kg (232 lb)    Examination:  General exam: Appears calm and comfortable  Respiratory system: Clear to auscultation. Respiratory effort normal. Cardiovascular system: S1 & S2 heard, RRR. No JVD, murmurs, rubs, gallops or clicks. No pedal edema. Gastrointestinal system: Abdomen is nondistended, soft and  TTP RLQ. No organomegaly or masses felt. Normal bowel sounds heard. Central nervous system: Alert and oriented. No focal neurological deficits. Extremities: Symmetric 5 x 5 power. Skin: No rashes, lesions or ulcers Psychiatry: Judgement and insight appear normal. Mood & affect appropriate.   Data Reviewed: I have personally reviewed following labs and imaging studies  CBC:  Recent Labs Lab 04/03/17 1355 04/04/17 0618  WBC 10.5 12.4*  HGB 15.2 14.3  HCT 44.5 43.5  MCV 89.5 91.0  PLT 240 846   Basic Metabolic Panel:  Recent Labs Lab 04/03/17 1355 04/03/17 1815 04/04/17 0618  NA 137   --  136  K 4.2  --  4.5  CL 106  --  104  CO2 21*  --  24  GLUCOSE 141*  --  111*  BUN 23*  --  15  CREATININE 0.84  --  0.87  CALCIUM 9.3  --  8.9  MG  --  1.9  --    GFR: Estimated Creatinine Clearance: 101.7 mL/min (by C-G formula based on SCr of 0.87 mg/dL). Liver Function Tests:  Recent Labs Lab 04/03/17 1355 04/04/17 0618  AST 25 18  ALT 28 23  ALKPHOS 81 77  BILITOT 0.7 0.8  PROT 6.9 6.2*  ALBUMIN 4.1 3.7    Recent Labs Lab 04/03/17 1355  LIPASE 20   No results for input(s): AMMONIA in the last 168 hours. Coagulation Profile:  Recent Labs Lab 04/04/17 0030  INR 1.01   Cardiac Enzymes:  Recent Labs Lab 04/03/17 1815 04/04/17 0030 04/04/17 0618  TROPONINI <0.03 <0.03 <0.03   BNP (last 3 results) No results for input(s): PROBNP in the last 8760 hours. HbA1C: No results for input(s): HGBA1C in the last 72 hours. CBG: No results for input(s): GLUCAP in the last 168 hours. Lipid Profile: No results for input(s): CHOL, HDL, LDLCALC, TRIG, CHOLHDL, LDLDIRECT in the last 72 hours. Thyroid Function Tests: No results for input(s): TSH, T4TOTAL, FREET4, T3FREE, THYROIDAB in the last 72 hours. Anemia Panel: No results for input(s): VITAMINB12, FOLATE, FERRITIN, TIBC, IRON, RETICCTPCT in the last 72 hours. Sepsis Labs: No results for input(s): PROCALCITON, LATICACIDVEN in the last 168 hours.  No results found for this or any previous visit (from the past 240 hour(s)).     Radiology Studies: Dg Chest Portable 1 View  Result Date: 04/03/2017 CLINICAL DATA:  Generalized chest pain with bilateral arm numbness as well as abdominal pain. EXAM: PORTABLE CHEST 1 VIEW COMPARISON:  Chest x-ray of August 31st 2009 FINDINGS: The lungs are adequately inflated and clear. The heart and pulmonary vascularity are normal. The mediastinum is normal in width. There is no pleural effusion. There is calcification in the wall of the aortic arch. The observed bony structures  are unremarkable. IMPRESSION: There is no acute cardiopulmonary abnormality. Thoracic aortic atherosclerosis. Electronically Signed   By: David  Martinique M.D.   On: 04/03/2017 14:05   Ct Angio Chest/abd/pel For Dissection W And/or Wo Contrast  Result Date: 04/03/2017 CLINICAL DATA:  66 year old male with pain all over and tingling in the arms. EXAM: CT ANGIOGRAPHY CHEST, ABDOMEN AND PELVIS TECHNIQUE: Multidetector CT imaging through the chest, abdomen and pelvis was performed using the standard protocol during bolus administration of intravenous contrast. Multiplanar reconstructed images and MIPs were obtained and reviewed to evaluate the vascular anatomy. Before administration of the IV contrast, a noncontrast chest CT was acquired. CONTRAST:  100 cc Isovue 370 COMPARISON:  Chest x-ray 04/03/2017 FINDINGS: CTA CHEST FINDINGS Cardiovascular: Heart:  No cardiomegaly. No pericardial fluid/thickening. Calcifications of left main, left anterior descending, circumflex, right coronary arteries. Aorta: Diameter of ascending aorta measures 3.2 cm. No dissection. No periaortic fluid. Mild atherosclerotic changes of the aortic arch. Branch vessels are patent. Three vessel arch. Mild atherosclerotic changes of the descending thoracic aorta. No aneurysm. Pulmonary arteries: Timing of the contrast bolus not optimized for evaluation of pulmonary arteries, however, no lobar, segmental, or proximal subsegmental filling defects. Mediastinum/Nodes: Multiple calcified mediastinal lymph nodes, predominantly subcarinal in in the right hilum. Additional lymph nodes throughout all nodal stations which are not enlarged by CT size criteria. No comparison available. Unremarkable course of the esophagus.  Small hiatal hernia. Lungs/Pleura: Calcified granuloma at the right lung base. Atelectasis/scarring at the bilateral dependent lung bases. No confluent airspace disease. No pneumothorax. No pleural effusion. No pneumothorax.  Musculoskeletal: No displaced fracture. Degenerative changes of the spine. Review of the MIP images confirms the above findings. CTA ABDOMEN AND PELVIS FINDINGS VASCULAR Aorta: No dissection. No aneurysm. No periaortic fluid. Atherosclerotic changes of the abdominal aorta with mixed calcified and soft plaque. Celiac: No significant atherosclerotic changes at the origin of the celiac artery. Typical branch pattern. SMA: Atherosclerotic changes superior mesenteric artery without significant stenosis. Renals: Bilateral renal arteries with mild atherosclerotic changes and no significant stenosis. IMA: Inferior mesenteric artery is patent. Left colic artery patent. Superior rectal artery patent. Right lower extremity: Mild atherosclerotic changes of the right iliac system without aneurysm, dissection, or occlusion. External iliac artery patent. Hypogastric artery patent. Proximal right femoral vasculature patent. Left lower extremity: Atherosclerotic changes of the left iliac system without stenosis, occlusion, dissection, or aneurysm. Hypogastric artery is patent. Left external iliac artery patent. Proximal femoral vasculature patent. Veins: Unremarkable appearance of the venous system. Review of the MIP images confirms the above findings. NON-VASCULAR Hepatobiliary: Unremarkable appearance of the liver. Unremarkable gall bladder. Pancreas: Unremarkable appearance of the pancreas. No pericholecystic fluid or inflammatory changes. Unremarkable ductal system. Spleen: Punctate calcifications within splenic parenchyma. Adrenals/Urinary Tract: Unremarkable appearance of adrenal glands. Right: No hydronephrosis. Symmetric perfusion to the left. No nephrolithiasis. Unremarkable course of the right ureter. Left: No hydronephrosis. Symmetric perfusion to the right. No nephrolithiasis. Unremarkable course of the left ureter. Circumferential bladder wall thickening.  No inflammatory changes. Stomach/Bowel: Unremarkable appearance  of the stomach. Unremarkable appearance of small bowel. No evidence of obstruction. Colonic diverticular without evidence of acute diverticulitis. Normal appendix. Lymphatic: Multiple lymph nodes in the para-aortic nodal station, none of which are enlarged. Mesenteric: No free fluid or air. No adenopathy. Reproductive: Transverse diameter of the prostate measures 6.1 cm. Other: No hernia. Musculoskeletal: No acute fracture. Multilevel degenerative changes of the spine. No bony canal narrowing. Vacuum disc phenomenon at L1-L2 and L2-L3. Advanced degenerative disc disease at L5-S1. Mild bilateral degenerative changes of the hips. IMPRESSION: No CT evidence of acute aortic syndrome. No acute finding to account for the patient's symptoms. Enlarged prostate, with circumferential bladder wall thickening, potentially representing chronic bladder outlet obstruction. Aortic atherosclerosis, as well as left main and 3 vessel coronary artery disease. Evidence of prior granulomatous disease. Small hiatal hernia. Diverticular disease without evidence of acute diverticulitis. Signed, Dulcy Fanny. Earleen Newport, DO [facility] Radiology Electronically Signed   By: Corrie Mckusick D.O.   On: 04/03/2017 16:05      Scheduled Meds: . [START ON 04/05/2017] aspirin EC  81 mg Oral Daily  . atorvastatin  80 mg Oral Daily  . buPROPion  300 mg Oral Daily  .  chlorhexidine  15 mL Mouth/Throat BID  . [START ON 04/05/2017] citalopram  20 mg Oral Daily  . ketoconazole   Topical Daily  . lisinopril  5 mg Oral Daily  . methylphenidate  36 mg Oral Daily  . metoprolol succinate  50 mg Oral Daily  . pantoprazole  40 mg Oral Daily  . sodium chloride flush  3 mL Intravenous Q12H   Continuous Infusions: . sodium chloride 1 mL/kg/hr (04/04/17 0857)  . heparin 1,500 Units/hr (04/04/17 0855)     LOS: 0 days    Time spent: 40 minutes   Dessa Phi, DO [facility] [email or web address] Password [email or web address] 04/04/2017, 11:57 AM

## 2017-04-04 NOTE — Progress Notes (Signed)
Progress Note  Patient Name: Kenneth Henderson Date of Encounter: 04/04/2017  Primary Cardiologist: Dr. Aundra Dubin  Subjective   On standyby for Cardiac Cath today, no re occurance of CP last night or this morning.  He is complaining of abdominal pain overnight, RUQ and LLQ, relieved with flatulence and belching WBC is 12.4, LFT's are normal, unremarkable gallbladder on CT angio of chest/abd/pelv done yesterday.  Inpatient Medications    Scheduled Meds: . [START ON 04/05/2017] aspirin EC  81 mg Oral Daily  . atorvastatin  80 mg Oral Daily  . buPROPion  300 mg Oral Daily  . chlorhexidine  15 mL Mouth/Throat BID  . citalopram  40 mg Oral Daily  . ketoconazole   Topical Daily  . lisinopril  5 mg Oral Daily  . methylphenidate  36 mg Oral Daily  . metoprolol succinate  50 mg Oral Daily  . pantoprazole  40 mg Oral Daily  . sodium chloride flush  3 mL Intravenous Q12H   Continuous Infusions: . sodium chloride    . heparin 1,500 Units/hr (04/04/17 0201)   PRN Meds: sodium chloride, acetaminophen, ALPRAZolam, morphine injection, nitroGLYCERIN, ondansetron **OR** ondansetron (ZOFRAN) IV, sodium chloride flush   Vital Signs    Vitals:   04/03/17 2007 04/04/17 0334 04/04/17 0336 04/04/17 0851  BP: (!) 149/86 (!) 161/82 (!) 146/73 (!) 173/72  Pulse: 74 72  68  Resp: 18 18  18   Temp: 98.5 F (36.9 C) 98.6 F (37 C)  98.4 F (36.9 C)  TempSrc: Oral Oral  Oral  SpO2: 96% 95%  96%  Weight: 232 lb (105.2 kg)     Height: 5\' 11"  (1.803 m)       Intake/Output Summary (Last 24 hours) at 04/04/17 0854 Last data filed at 04/04/17 0557  Gross per 24 hour  Intake           323.99 ml  Output                0 ml  Net           323.99 ml   Filed Weights   04/03/17 1350 04/03/17 2007  Weight: 235 lb (106.6 kg) 232 lb (105.2 kg)    Telemetry    NSR - Personally Reviewed  ECG    04/04/17, NSR - Personally Reviewed  Physical Exam   GEN: No acute distress.   Neck: No JVD Cardiac:  RRR, no murmurs, rubs, or gallops.  Respiratory: Clear to auscultation bilaterally.  GI: Soft, RUQ abdominal pain with mild guarding, no RUQ tenderness, otherwise unremarkable. MS: No edema; No deformity. Neuro:  Nonfocal  Psych: Normal affect   Labs    Chemistry Recent Labs Lab 04/03/17 1355 04/04/17 0618  NA 137 136  K 4.2 4.5  CL 106 104  CO2 21* 24  GLUCOSE 141* 111*  BUN 23* 15  CREATININE 0.84 0.87  CALCIUM 9.3 8.9  PROT 6.9 6.2*  ALBUMIN 4.1 3.7  AST 25 18  ALT 28 23  ALKPHOS 81 77  BILITOT 0.7 0.8  GFRNONAA >60 >60  GFRAA >60 >60  ANIONGAP 10 8     Hematology Recent Labs Lab 04/03/17 1355 04/04/17 0618  WBC 10.5 12.4*  RBC 4.97 4.78  HGB 15.2 14.3  HCT 44.5 43.5  MCV 89.5 91.0  MCH 30.6 29.9  MCHC 34.2 32.9  RDW 13.1 13.4  PLT 240 208    Cardiac Enzymes Recent Labs Lab 04/03/17 1815 04/04/17 0030 04/04/17 0618  TROPONINI <0.03 <  0.03 <0.03    Recent Labs Lab 04/03/17 1400  TROPIPOC 0.00     Radiology    Dg Chest Portable 1 View Result Date: 04/03/2017 There is no acute cardiopulmonary abnormality. Thoracic aortic atherosclerosis.    Ct Angio Chest/abd/pel For Dissection W And/or Wo Contrast Result Date: 04/03/2017  No CT evidence of acute aortic syndrome. No acute finding to account for the patient's symptoms. Enlarged prostate, with circumferential bladder wall thickening, potentially representing chronic bladder outlet obstruction. Aortic atherosclerosis, as well as left main and 3 vessel coronary artery disease. Evidence of prior granulomatous disease. Small hiatal hernia. Diverticular disease without evidence of acute diverticulitis  Cardiac Studies   Heart Cath pending for today   Transthoracic Echocardiography 09/06/2014 Study Conclusions  - Left ventricle: Normal global longitudinal strain -18.8 %. Mildly decreased laterla S prime 8 cm/sec, The cavity size was mildly dilated. Systolic function was normal. The  estimated ejection fraction was in the range of 60% to 65%. Wall motion was normal; there were no regional wall motion abnormalities. Features are consistent with a pseudonormal left ventricular filling pattern, with concomitant abnormal relaxation and increased filling pressure (grade 2 diastolic dysfunction). There was no evidence of elevated ventricular filling pressure by Doppler parameters. - Aortic valve: There was mild regurgitation. - Mitral valve: Structurally normal valve. There was mild regurgitation. - Left atrium: The atrium was moderately dilated. - Right ventricle: Systolic function was normal. - Right atrium: The atrium was normal in size. - Pulmonic valve: There was no regurgitation. - Pericardium, extracardiac: There was no pericardial effusion.  Impressions:  - Mildly dilated left ventricle with normal systolic function and grade 2 diastolic dysfunction with normal filling pressures. Normal RV size and systolic function. Moderate left atrial dilatation. Mild mitral regurgitation  Patient Profile     HPI: Kenneth Henderson is a 66 y.o. male who is being seen for chest pain at the request of Dr. Tyrone Nine.  He has a history of CAD with cath 06/2014 in setting of inferior MI showing occluded RCA and underwent PCI with DES to the RCA.  EF at that time was 40-45% and echo 9/15 showed EF 60-65% with normal RV in [place]. Has been off Plavix since 01/10/16.    Assessment & Plan    1.  Unstable angina - Atypical but similar to previous MI.  Refuses stress test and wants a Cath which is scheduled for today. -- abdominal pain overnight, medicine can consider RUQ [place] if cath is unremarkable.  2.  ASCAD with remote inferior MI s/p PCI of occluded RCA with DES.  He has been off plavix for a year.   3.  HTN - BP borderline.  Continue BB and ACE I and adjust as needed for BP control.   --BP elevated this morning but patient has not yet had morning meds, will  continue to follow.  4.  Hyperlipidemia on statin.  -- ALT normal -- FLP, pending   Plan: To discuss with MD, cath on standby for today, no re occurring CP last night. He has hd RUQ and RLQ abdominal pain overnight. Currently no RLQ abd pain, appendicitis highly unlikely. Normal gallbladder on CT, Medicine can consider RUQ [place] if cath unremarkable for culprit lesion.  Kristopher Glee, PA-C  04/04/2017, 8:54 AM     Attending Note:   The patient was seen and examined.  Agree with assessment and plan as noted above.  Changes made to the above note as needed.  Patient seen and independently  examined with Delos Haring, PA .   We discussed all aspects of the encounter. I agree with the assessment and plan as stated above.  1.  CAD :   Symptoms are atypical but similar to his presenting symptoms prior to stenting  Agree with plans for cath today  If cath is unremarkable , he will need to have his abdominal pain evaluated.   2. Hyperlipidemia: Well controlled , on statin     I have spent a total of 30 minutes with patient reviewing hospital  notes , telemetry, EKGs, labs and examining patient as well as establishing an assessment and plan that was discussed with the patient. > 50% of time was spent in direct patient care.    Thayer Headings, Brooke Bonito., MD, [facility] 04/04/2017, 9:37 AM [address]  [place] Pager [phone]

## 2017-04-04 NOTE — H&P (View-Only) (Signed)
Progress Note  Patient Name: Kenneth Henderson Date of Encounter: 04/04/2017  Primary Cardiologist: Dr. Aundra Dubin  Subjective   On standyby for Cardiac Cath today, no re occurance of CP last night or this morning.  He is complaining of abdominal pain overnight, RUQ and LLQ, relieved with flatulence and belching WBC is 12.4, LFT's are normal, unremarkable gallbladder on CT angio of chest/abd/pelv done yesterday.  Inpatient Medications    Scheduled Meds: . [START ON 04/05/2017] aspirin EC  81 mg Oral Daily  . atorvastatin  80 mg Oral Daily  . buPROPion  300 mg Oral Daily  . chlorhexidine  15 mL Mouth/Throat BID  . citalopram  40 mg Oral Daily  . ketoconazole   Topical Daily  . lisinopril  5 mg Oral Daily  . methylphenidate  36 mg Oral Daily  . metoprolol succinate  50 mg Oral Daily  . pantoprazole  40 mg Oral Daily  . sodium chloride flush  3 mL Intravenous Q12H   Continuous Infusions: . sodium chloride    . heparin 1,500 Units/hr (04/04/17 0201)   PRN Meds: sodium chloride, acetaminophen, ALPRAZolam, morphine injection, nitroGLYCERIN, ondansetron **OR** ondansetron (ZOFRAN) IV, sodium chloride flush   Vital Signs    Vitals:   04/03/17 2007 04/04/17 0334 04/04/17 0336 04/04/17 0851  BP: (!) 149/86 (!) 161/82 (!) 146/73 (!) 173/72  Pulse: 74 72  68  Resp: 18 18  18   Temp: 98.5 F (36.9 C) 98.6 F (37 C)  98.4 F (36.9 C)  TempSrc: Oral Oral  Oral  SpO2: 96% 95%  96%  Weight: 232 lb (105.2 kg)     Height: 5\' 11"  (1.803 m)       Intake/Output Summary (Last 24 hours) at 04/04/17 0854 Last data filed at 04/04/17 0557  Gross per 24 hour  Intake           323.99 ml  Output                0 ml  Net           323.99 ml   Filed Weights   04/03/17 1350 04/03/17 2007  Weight: 235 lb (106.6 kg) 232 lb (105.2 kg)    Telemetry    NSR - Personally Reviewed  ECG    04/04/17, NSR - Personally Reviewed  Physical Exam   GEN: No acute distress.   Neck: No JVD Cardiac:  RRR, no murmurs, rubs, or gallops.  Respiratory: Clear to auscultation bilaterally.  GI: Soft, RUQ abdominal pain with mild guarding, no RUQ tenderness, otherwise unremarkable. MS: No edema; No deformity. Neuro:  Nonfocal  Psych: Normal affect   Labs    Chemistry Recent Labs Lab 04/03/17 1355 04/04/17 0618  NA 137 136  K 4.2 4.5  CL 106 104  CO2 21* 24  GLUCOSE 141* 111*  BUN 23* 15  CREATININE 0.84 0.87  CALCIUM 9.3 8.9  PROT 6.9 6.2*  ALBUMIN 4.1 3.7  AST 25 18  ALT 28 23  ALKPHOS 81 77  BILITOT 0.7 0.8  GFRNONAA >60 >60  GFRAA >60 >60  ANIONGAP 10 8     Hematology Recent Labs Lab 04/03/17 1355 04/04/17 0618  WBC 10.5 12.4*  RBC 4.97 4.78  HGB 15.2 14.3  HCT 44.5 43.5  MCV 89.5 91.0  MCH 30.6 29.9  MCHC 34.2 32.9  RDW 13.1 13.4  PLT 240 208    Cardiac Enzymes Recent Labs Lab 04/03/17 1815 04/04/17 0030 04/04/17 0618  TROPONINI <0.03 <  0.03 <0.03    Recent Labs Lab 04/03/17 1400  TROPIPOC 0.00     Radiology    Dg Chest Portable 1 View Result Date: 04/03/2017 There is no acute cardiopulmonary abnormality. Thoracic aortic atherosclerosis.    Ct Angio Chest/abd/pel For Dissection W And/or Wo Contrast Result Date: 04/03/2017  No CT evidence of acute aortic syndrome. No acute finding to account for the patient's symptoms. Enlarged prostate, with circumferential bladder wall thickening, potentially representing chronic bladder outlet obstruction. Aortic atherosclerosis, as well as left main and 3 vessel coronary artery disease. Evidence of prior granulomatous disease. Small hiatal hernia. Diverticular disease without evidence of acute diverticulitis  Cardiac Studies   Heart Cath pending for today   Transthoracic Echocardiography 09/06/2014 Study Conclusions  - Left ventricle: Normal global longitudinal strain -18.8 %. Mildly decreased laterla S prime 8 cm/sec, The cavity size was mildly dilated. Systolic function was normal. The  estimated ejection fraction was in the range of 60% to 65%. Wall motion was normal; there were no regional wall motion abnormalities. Features are consistent with a pseudonormal left ventricular filling pattern, with concomitant abnormal relaxation and increased filling pressure (grade 2 diastolic dysfunction). There was no evidence of elevated ventricular filling pressure by Doppler parameters. - Aortic valve: There was mild regurgitation. - Mitral valve: Structurally normal valve. There was mild regurgitation. - Left atrium: The atrium was moderately dilated. - Right ventricle: Systolic function was normal. - Right atrium: The atrium was normal in size. - Pulmonic valve: There was no regurgitation. - Pericardium, extracardiac: There was no pericardial effusion.  Impressions:  - Mildly dilated left ventricle with normal systolic function and grade 2 diastolic dysfunction with normal filling pressures. Normal RV size and systolic function. Moderate left atrial dilatation. Mild mitral regurgitation  Patient Profile     HPI: Kenneth Henderson is a 66 y.o. male who is being seen for chest pain at the request of Dr. Tyrone Nine.  He has a history of CAD with cath 06/2014 in setting of inferior MI showing occluded RCA and underwent PCI with DES to the RCA.  EF at that time was 40-45% and echo 9/15 showed EF 60-65% with normal RV in [place]. Has been off Plavix since 01/10/16.    Assessment & Plan    1.  Unstable angina - Atypical but similar to previous MI.  Refuses stress test and wants a Cath which is scheduled for today. -- abdominal pain overnight, medicine can consider RUQ [place] if cath is unremarkable.  2.  ASCAD with remote inferior MI s/p PCI of occluded RCA with DES.  He has been off plavix for a year.   3.  HTN - BP borderline.  Continue BB and ACE I and adjust as needed for BP control.   --BP elevated this morning but patient has not yet had morning meds, will  continue to follow.  4.  Hyperlipidemia on statin.  -- ALT normal -- FLP, pending   Plan: To discuss with MD, cath on standby for today, no re occurring CP last night. He has hd RUQ and RLQ abdominal pain overnight. Currently no RLQ abd pain, appendicitis highly unlikely. Normal gallbladder on CT, Medicine can consider RUQ [place] if cath unremarkable for culprit lesion.  Kristopher Glee, PA-C  04/04/2017, 8:54 AM     Attending Note:   The patient was seen and examined.  Agree with assessment and plan as noted above.  Changes made to the above note as needed.  Patient seen and independently  examined with Delos Haring, PA .   We discussed all aspects of the encounter. I agree with the assessment and plan as stated above.  1.  CAD :   Symptoms are atypical but similar to his presenting symptoms prior to stenting  Agree with plans for cath today  If cath is unremarkable , he will need to have his abdominal pain evaluated.   2. Hyperlipidemia: Well controlled , on statin     I have spent a total of 30 minutes with patient reviewing hospital  notes , telemetry, EKGs, labs and examining patient as well as establishing an assessment and plan that was discussed with the patient. > 50% of time was spent in direct patient care.    Thayer Headings, Brooke Bonito., MD, [facility] 04/04/2017, 9:37 AM [address]  [place] Pager [phone]

## 2017-04-05 ENCOUNTER — Observation Stay (HOSPITAL_COMMUNITY): Payer: Medicare Other

## 2017-04-05 DIAGNOSIS — R072 Precordial pain: Secondary | ICD-10-CM | POA: Diagnosis not present

## 2017-04-05 DIAGNOSIS — R079 Chest pain, unspecified: Secondary | ICD-10-CM

## 2017-04-05 DIAGNOSIS — K824 Cholesterolosis of gallbladder: Secondary | ICD-10-CM | POA: Diagnosis not present

## 2017-04-05 DIAGNOSIS — R1011 Right upper quadrant pain: Secondary | ICD-10-CM | POA: Diagnosis not present

## 2017-04-05 DIAGNOSIS — I25110 Atherosclerotic heart disease of native coronary artery with unstable angina pectoris: Secondary | ICD-10-CM | POA: Diagnosis not present

## 2017-04-05 LAB — BASIC METABOLIC PANEL
Anion gap: 8 (ref 5–15)
BUN: 15 mg/dL (ref 6–20)
CO2: 26 mmol/L (ref 22–32)
Calcium: 8.9 mg/dL (ref 8.9–10.3)
Chloride: 101 mmol/L (ref 101–111)
Creatinine, Ser: 0.84 mg/dL (ref 0.61–1.24)
GFR calc Af Amer: >60 mL/min (ref >60)
GFR calc non Af Amer: >60 mL/min (ref >60)
Glucose, Bld: 118 mg/dL — ABNORMAL HIGH (ref 65–99)
Potassium: 3.7 mmol/L (ref 3.5–5.1)
Sodium: 135 mmol/L (ref 135–145)

## 2017-04-05 MED ORDER — TECHNETIUM TC 99M MEBROFENIN IV KIT: 5.0000 | PACK | Freq: Once | INTRAVENOUS | Status: DC | PRN

## 2017-04-05 MED ORDER — CITALOPRAM HYDROBROMIDE 20 MG PO TABS: 20.0000 mg | ORAL_TABLET | Freq: Every day | ORAL | 0 refills | Status: AC

## 2017-04-05 NOTE — Progress Notes (Signed)
Patient refuses to stay over night to get the rest of his test results. MD paged and placed discharge orders. Patient given instructions and advised to make a follow up appointment to get results.

## 2017-04-05 NOTE — Discharge Summary (Signed)
Physician Discharge Summary  [name] ELF:[id number] DOB: 26-Feb-1951 DOA: 04/03/2017  PCP: Shirline Frees, MD  Admit date: 04/03/2017 Discharge date: 04/05/2017  Admitted From: Home Disposition:  Home  Recommendations for Outpatient Follow-up:  1. Follow up with PCP in 1 week 2. Need to update lipid profile. Continue lipitor current dose for now.  3. Follow up with Cardiology in 2-4 weeks  4. Please obtain CBC in 1 week  5. Please follow up on the following pending results: HIDA scan pending at time of discharge  Discharge Condition: Stable CODE STATUS: Full Diet recommendation: Heart healthy   Brief/Interim Summary: Kenneth Henderson a 66 y.o.malewith medical history significant of actinic keratosis, anxiety, ADD, hyperlipidemia, hypertension, CAD, history of inferior MI inJuly 2015 with 100 % occlusion to the RCA, S/P PCI with DES was coming to the emergency department with complaints ofabdominal pain for the past 3-4 days, which is associated today with left-sided chest pain radiates to his left arm and tingling of both hands. These symptoms were similar to the the ones he had in 2015 when he was diagnosed with acute MI. He stated that he took sublingual nitroglycerin which relieved the symptoms. Patient admitted for further work up.   Cardiology evaluated patient regarding chest pain. He underwent cardiac catheterization on 4/13, results as below, no evidence of ischemia and CAD is stable. Chest pain resolved. Due to continued RUQ pain, he underwent RUQ [place] which was unremarkable. HIDA scan was completed, but as his symptoms improved, he elected to leave the hospital before results came back and to follow up with PCP.   Discharge Diagnoses:  Principal Problem:   Chest pain Active Problems:   CAD (coronary artery disease)   Cardiomyopathy, ischemic   Hyperlipidemia   Hypertension   Anxiety   Attention deficit disorder (ADD) in adult  Chest pain  -Cardiology following, heart  cath 4/13 without acute ischemia. No further cardiac workup planned  -Troponin negative x 3  -CP resolved   RUQ, RLQ abdominal pain -RUQ [place] completed without acute cholecystitis but has continued complaints of pain. CTA abdomen/pelvis without acute abnormality. Lipase, LFT normal.  -HIDA ordered to assess gallbladder function. This was completed, but prior to obtaining results, patient stated he was feeling better and wanted to be discharged with follow up with PCP as outpatient for results.  CAD, that is post PCI to DES to the RCA in 2015 -Daily aspirin, lipitor, metoprolol   Hyperlipidemia -Continue Lipitor -Follow up outpatient for lipid profile and adjustment of lipitor dose if needed   Hypertension -Continue lisinopril, metoprolol  Anxiety/depression/ADHD -Continue Celexa, Wellbutrin, metadate. Celexa dose is decreased due to risk for QT prolongation   Discharge Instructions  Discharge Instructions    Call MD for:  difficulty breathing, headache or visual disturbances    Complete by:  As directed    Call MD for:  extreme fatigue    Complete by:  As directed    Call MD for:  persistant dizziness or light-headedness    Complete by:  As directed    Call MD for:  persistant nausea and vomiting    Complete by:  As directed    Call MD for:  severe uncontrolled pain    Complete by:  As directed    Call MD for:  temperature >100.4    Complete by:  As directed    Diet - low sodium heart healthy    Complete by:  As directed    Discharge instructions    Complete  by:  As directed    You were cared for by a hospitalist during your hospital stay. If you have any questions about your discharge medications or the care you received while you were in the hospital after you are discharged, you can call the unit and asked to speak with the hospitalist on call if the hospitalist that took care of you is not available. Once you are discharged, your primary care physician will handle any  further medical issues. Please note that NO REFILLS for any discharge medications will be authorized once you are discharged, as it is imperative that you return to your primary care physician (or establish a relationship with a primary care physician if you do not have one) for your aftercare needs so that they can reassess your need for medications and monitor your lab values.   Discharge instructions    Complete by:  As directed    Follow up with PCP regarding HIDA scan results   Discharge instructions    Complete by:  As directed    Your dose of celexa 40mg  has been decreased to 20mg  daily due to risk of QTc prolongation determined by hospital pharmacist. Follow up with your prescriber.   Increase activity slowly    Complete by:  As directed      Allergies as of 04/05/2017   No Known Allergies     Medication List    STOP taking these medications   azithromycin 500 MG tablet Commonly known as:  ZITHROMAX     TAKE these medications   aspirin 81 MG tablet Take 81 mg by mouth daily.   atorvastatin 80 MG tablet Commonly known as:  LIPITOR Take 80 mg by mouth daily.   buPROPion 300 MG 24 hr tablet Commonly known as:  WELLBUTRIN XL Take 300 mg by mouth daily.   chlorhexidine 0.12 % solution Commonly known as:  PERIDEX RINSE MOUTH WITH 15ML (1 CAPFUL) FOR 30 SECONDS am AND PM AFTER TOOTHBRUSHING. EXPECTORATE AFTER RINSING, DO NOT SWALLOW   citalopram 20 MG tablet Commonly known as:  CELEXA Take 1 tablet (20 mg total) by mouth daily. What changed:  medication strength  how much to take   esomeprazole 40 MG capsule Commonly known as:  NEXIUM Take 40 mg by mouth daily at 12 noon.   Fish Oil 1000 MG Caps Take 1,000 mg by mouth daily.   fluorouracil 5 % cream Commonly known as:  EFUDEX APPLY TO AFFECTED AREA OF SKIN 2 TIMES DAILY AS DIRECTED FOR 2 WEEKS   GLUCOSAMINE-CHONDROITIN PO Take 1 tablet by mouth daily.   ketoconazole 2 % cream Commonly known as:   NIZORAL APPLY TO AFFECTED AREA 2 TIMES DAILY FOR 2 WEEKS   lisinopril 5 MG tablet Commonly known as:  PRINIVIL,ZESTRIL Take 5 mg by mouth daily.   methylphenidate 40 MG CR capsule Commonly known as:  METADATE CD Take 40 mg by mouth every morning.   metoprolol succinate 50 MG 24 hr tablet Commonly known as:  TOPROL-XL Take 50 mg by mouth daily. Take with or immediately following a meal.   Naftifine HCl 2 % Crea Commonly known as:  NAFTIN Apply 1 application topically 1 day or 1 dose.   NITROSTAT 0.4 MG SL tablet Generic drug:  nitroGLYCERIN Place 0.4 mg under the tongue every 5 (five) minutes as needed for chest pain.   PREVIDENT 5000 SENSITIVE 1.1-5 % Pste Generic drug:  Sod Fluoride-Potassium Nitrate BRUSH ON teeth 2 TIMES DAILY FOR 2 minutes. spit  out as much possible. DO not SWISH, eat, OR drink FOR 30 minutes      Follow-up Information    Shirline Frees, MD. Schedule an appointment as soon as possible for a visit in 1 week(s).   Specialty:  Family Medicine Contact information: [place] [address] [phone]        Loralie Champagne, MD. Schedule an appointment as soon as possible for a visit in 2 week(s).   Specialty:  Cardiology Contact information: [address] [place] [address] [phone]          No Known Allergies  Consultations:  Cardiology   Procedures/Studies: Nm Hepato W/eject Fract  Result Date: 04/05/2017 CLINICAL DATA:  Right upper quadrant pain radiating to chest for 3 days. EXAM: NUCLEAR MEDICINE HEPATOBILIARY IMAGING WITH GALLBLADDER EF TECHNIQUE: Sequential images of the abdomen were obtained out to 60 minutes following intravenous administration of radiopharmaceutical. After oral ingestion of Ensure, gallbladder ejection fraction was determined. At 60 min, normal ejection fraction is greater than 33%. RADIOPHARMACEUTICALS:  4.8 MCi Tc-75m  Choletec IV COMPARISON:  Ultrasound of  earlier today.  CT of 04/03/2017. FINDINGS: Prompt uptake and biliary excretion of activity by the liver is seen. Gallbladder activity is visualized, consistent with patency of cystic duct. Biliary activity passes into small bowel, consistent with patent common bile duct. Calculated gallbladder ejection fraction is 75%. (Normal gallbladder ejection fraction with Ensure is greater than 33%.) No discomfort with Ensure. IMPRESSION: Normal nuclear medicine hepatobiliary study with ejection fraction. Electronically Signed   By: Abigail Miyamoto M.D.   On: 04/05/2017 18:48   Dg Chest Portable 1 View  Result Date: 04/03/2017 CLINICAL DATA:  Generalized chest pain with bilateral arm numbness as well as abdominal pain. EXAM: PORTABLE CHEST 1 VIEW COMPARISON:  Chest x-ray of August 31st 2009 FINDINGS: The lungs are adequately inflated and clear. The heart and pulmonary vascularity are normal. The mediastinum is normal in width. There is no pleural effusion. There is calcification in the wall of the aortic arch. The observed bony structures are unremarkable. IMPRESSION: There is no acute cardiopulmonary abnormality. Thoracic aortic atherosclerosis. Electronically Signed   By: David  Martinique M.D.   On: 04/03/2017 14:05   Ct Angio Chest/abd/pel For Dissection W And/or Wo Contrast  Result Date: 04/03/2017 CLINICAL DATA:  66 year old male with pain all over and tingling in the arms. EXAM: CT ANGIOGRAPHY CHEST, ABDOMEN AND PELVIS TECHNIQUE: Multidetector CT imaging through the chest, abdomen and pelvis was performed using the standard protocol during bolus administration of intravenous contrast. Multiplanar reconstructed images and MIPs were obtained and reviewed to evaluate the vascular anatomy. Before administration of the IV contrast, a noncontrast chest CT was acquired. CONTRAST:  100 cc Isovue 370 COMPARISON:  Chest x-ray 04/03/2017 FINDINGS: CTA CHEST FINDINGS Cardiovascular: Heart: No cardiomegaly. No pericardial  fluid/thickening. Calcifications of left main, left anterior descending, circumflex, right coronary arteries. Aorta: Diameter of ascending aorta measures 3.2 cm. No dissection. No periaortic fluid. Mild atherosclerotic changes of the aortic arch. Branch vessels are patent. Three vessel arch. Mild atherosclerotic changes of the descending thoracic aorta. No aneurysm. Pulmonary arteries: Timing of the contrast bolus not optimized for evaluation of pulmonary arteries, however, no lobar, segmental, or proximal subsegmental filling defects. Mediastinum/Nodes: Multiple calcified mediastinal lymph nodes, predominantly subcarinal in in the right hilum. Additional lymph nodes throughout all nodal stations which are not enlarged by CT size criteria. No comparison available. Unremarkable course of the esophagus.  Small  hiatal hernia. Lungs/Pleura: Calcified granuloma at the right lung base. Atelectasis/scarring at the bilateral dependent lung bases. No confluent airspace disease. No pneumothorax. No pleural effusion. No pneumothorax. Musculoskeletal: No displaced fracture. Degenerative changes of the spine. Review of the MIP images confirms the above findings. CTA ABDOMEN AND PELVIS FINDINGS VASCULAR Aorta: No dissection. No aneurysm. No periaortic fluid. Atherosclerotic changes of the abdominal aorta with mixed calcified and soft plaque. Celiac: No significant atherosclerotic changes at the origin of the celiac artery. Typical branch pattern. SMA: Atherosclerotic changes superior mesenteric artery without significant stenosis. Renals: Bilateral renal arteries with mild atherosclerotic changes and no significant stenosis. IMA: Inferior mesenteric artery is patent. Left colic artery patent. Superior rectal artery patent. Right lower extremity: Mild atherosclerotic changes of the right iliac system without aneurysm, dissection, or occlusion. External iliac artery patent. Hypogastric artery patent. Proximal right femoral  vasculature patent. Left lower extremity: Atherosclerotic changes of the left iliac system without stenosis, occlusion, dissection, or aneurysm. Hypogastric artery is patent. Left external iliac artery patent. Proximal femoral vasculature patent. Veins: Unremarkable appearance of the venous system. Review of the MIP images confirms the above findings. NON-VASCULAR Hepatobiliary: Unremarkable appearance of the liver. Unremarkable gall bladder. Pancreas: Unremarkable appearance of the pancreas. No pericholecystic fluid or inflammatory changes. Unremarkable ductal system. Spleen: Punctate calcifications within splenic parenchyma. Adrenals/Urinary Tract: Unremarkable appearance of adrenal glands. Right: No hydronephrosis. Symmetric perfusion to the left. No nephrolithiasis. Unremarkable course of the right ureter. Left: No hydronephrosis. Symmetric perfusion to the right. No nephrolithiasis. Unremarkable course of the left ureter. Circumferential bladder wall thickening.  No inflammatory changes. Stomach/Bowel: Unremarkable appearance of the stomach. Unremarkable appearance of small bowel. No evidence of obstruction. Colonic diverticular without evidence of acute diverticulitis. Normal appendix. Lymphatic: Multiple lymph nodes in the para-aortic nodal station, none of which are enlarged. Mesenteric: No free fluid or air. No adenopathy. Reproductive: Transverse diameter of the prostate measures 6.1 cm. Other: No hernia. Musculoskeletal: No acute fracture. Multilevel degenerative changes of the spine. No bony canal narrowing. Vacuum disc phenomenon at L1-L2 and L2-L3. Advanced degenerative disc disease at L5-S1. Mild bilateral degenerative changes of the hips. IMPRESSION: No CT evidence of acute aortic syndrome. No acute finding to account for the patient's symptoms. Enlarged prostate, with circumferential bladder wall thickening, potentially representing chronic bladder outlet obstruction. Aortic atherosclerosis, as well  as left main and 3 vessel coronary artery disease. Evidence of prior granulomatous disease. Small hiatal hernia. Diverticular disease without evidence of acute diverticulitis. Signed, Dulcy Fanny. Earleen Newport, DO [facility] Electronically Signed   By: Corrie Mckusick D.O.   On: 04/03/2017 16:05   US Abdomen Limited Ruq  Result Date: 04/05/2017 CLINICAL DATA:  66 year old male with right upper quadrant abdominal pain. EXAM: US ABDOMEN LIMITED - RIGHT UPPER QUADRANT COMPARISON:  None FINDINGS: Gallbladder: A 1 mm gallbladder polyp is noted. There is no evidence of cholelithiasis or acute cholecystitis. Common bile duct: Diameter: 2.1 mm. There is no evidence of intrahepatic or extrahepatic biliary dilatation. Liver: No focal lesion identified. Within normal limits in parenchymal echogenicity. IMPRESSION: Tiny gallbladder polyp without other abnormality. Electronically Signed   By: Margarette Canada M.D.   On: 04/05/2017 10:18    Heart cath 04/04/2017  Moderate mid LAD disease with 40-50% narrowing. First diagonal has eccentric 75% stenosis. The diagonal is small.  Dominant right coronary. Widely patent stent in the mid vessel.  No significant obstruction in the circumflex. Luminal irregularities are noted.  Overall normal LV function.  Inferior wall hypokinesis. EF 50-55%.  RECOMMENDATIONS:   Continue aggressive risk factor modification.  Entertain other explanations for the patient's symptoms   Discharge Exam: Vitals:   04/05/17 0442 04/05/17 1316  BP: 124/64 (!) 147/68  Pulse: (!) 57 81  Resp: 18 18  Temp: 98.1 F (36.7 C) 98.2 F (36.8 C)   Vitals:   04/04/17 1800 04/04/17 2005 04/05/17 0442 04/05/17 1316  BP: (!) 146/78 (!) 146/76 124/64 (!) 147/68  Pulse: 66 70 (!) 57 81  Resp:  18 18 18   Temp:  98.6 F (37 C) 98.1 F (36.7 C) 98.2 F (36.8 C)  TempSrc:  Oral Oral Oral  SpO2:  94% 96% 95%  Weight:      Height:         General: Pt is alert, awake, not in acute distress Cardiovascular: RRR, S1/S2 +, no rubs, no gallops Respiratory: CTA bilaterally, no wheezing, no rhonchi Abdominal: Soft, TTP RUQ and RLQ, ND, bowel sounds + Extremities: no edema, no cyanosis    The results of significant diagnostics from this hospitalization (including imaging, microbiology, ancillary and laboratory) are listed below for reference.     Microbiology: No results found for this or any previous visit (from the past 240 hour(s)).   Labs: BNP (last 3 results) No results for input(s): BNP in the last 8760 hours. Basic Metabolic Panel:  Recent Labs Lab 04/03/17 1355 04/03/17 1815 04/04/17 0618 04/05/17 0401  NA 137  --  136 135  K 4.2  --  4.5 3.7  CL 106  --  104 101  CO2 21*  --  24 26  GLUCOSE 141*  --  111* 118*  BUN 23*  --  15 15  CREATININE 0.84  --  0.87 0.84  CALCIUM 9.3  --  8.9 8.9  MG  --  1.9  --   --    Liver Function Tests:  Recent Labs Lab 04/03/17 1355 04/04/17 0618  AST 25 18  ALT 28 23  ALKPHOS 81 77  BILITOT 0.7 0.8  PROT 6.9 6.2*  ALBUMIN 4.1 3.7    Recent Labs Lab 04/03/17 1355 04/04/17 1226  LIPASE 20 12   No results for input(s): AMMONIA in the last 168 hours. CBC:  Recent Labs Lab 04/03/17 1355 04/04/17 0618  WBC 10.5 12.4*  HGB 15.2 14.3  HCT 44.5 43.5  MCV 89.5 91.0  PLT 240 208   Cardiac Enzymes:  Recent Labs Lab 04/03/17 1815 04/04/17 0030 04/04/17 0618  TROPONINI <0.03 <0.03 <0.03   BNP: Invalid input(s): POCBNP CBG: No results for input(s): GLUCAP in the last 168 hours. D-Dimer No results for input(s): DDIMER in the last 72 hours. Hgb A1c No results for input(s): HGBA1C in the last 72 hours. Lipid Profile No results for input(s): CHOL, HDL, LDLCALC, TRIG, CHOLHDL, LDLDIRECT in the last 72 hours. Thyroid function studies No results for input(s): TSH, T4TOTAL, T3FREE, THYROIDAB in the last 72 hours.  Invalid input(s): FREET3 Anemia  work up No results for input(s): VITAMINB12, FOLATE, FERRITIN, TIBC, IRON, RETICCTPCT in the last 72 hours. Urinalysis No results found for: COLORURINE, APPEARANCEUR, LABSPEC, [place], GLUCOSEU, HGBUR, BILIRUBINUR, KETONESUR, PROTEINUR, UROBILINOGEN, NITRITE, LEUKOCYTESUR Sepsis Labs Invalid input(s): PROCALCITONIN,  WBC,  LACTICIDVEN Microbiology No results found for this or any previous visit (from the past 240 hour(s)).   Time coordinating discharge: 45 minutes  SIGNED:  Dessa Phi, DO [facility] Pager [phone]  If 7PM-7AM, please contact night-coverage [email or web address] Password [facility] 04/05/2017, 6:56 PM

## 2017-04-05 NOTE — Progress Notes (Signed)
PROGRESS NOTE    Kenneth Henderson  EXH:[id number] DOB: [birth year] DOA: 04/03/2017 PCP: Shirline Frees, MD     Brief Narrative:  Kenneth Henderson is a 66 y.o. male with medical history significant of actinic keratosis, anxiety, ADD, hyperlipidemia, hypertension, CAD, history of inferior MI in July 2015 with 100 % occlusion to the RCA, S/P PCI with DES was coming to the emergency department with complaints of abdominal pain for the past 3-4 days, which is associated today with left-sided chest pain radiates to his left arm and tingling of both hands. These symptoms were similar to the the ones he had in 2015 when he was diagnosed with acute MI. He stated that he took sublingual nitroglycerin which relieved the symptoms. Patient admitted for further work up.   Assessment & Plan:   Principal Problem:   Chest pain Active Problems:   CAD (coronary artery disease)   Cardiomyopathy, ischemic   Hyperlipidemia   Hypertension   Anxiety   Attention deficit disorder (ADD) in adult  Chest pain  -Cardiology following, heart cath 4/13 without acute ischemia. No further cardiac workup planned  -Troponin negative x 3  -CP resolved   RUQ, RLQ abdominal pain -RUQ [place] completed without acute cholecystitis but has continued complaints of pain. CTA abdomen/pelvis without acute abnormality. Lipase, LFT normal.  -HIDA ordered to assess gallbladder function   CAD, that is post PCI to DES to the RCA in 2015 -Daily aspirin, lipitor, metoprolol   Hyperlipidemia -Continue Lipitor -Follow up outpatient for lipid profile and adjustment of lipitor dose if needed   Hypertension -Continue lisinopril, metoprolol  Anxiety/depression/ADHD -Continue Celexa, Wellbutrin, metadate. Celexa dose is decreased due to risk for QT prolongation  DVT prophylaxis: heparin subq  Code Status: Full Family Communication: No family at bedside Disposition Plan: Pending further workup   Consultants:   Cardiology  Procedures:     Heart cath 4/13   Antimicrobials:   None    Subjective: Patient continues to have abdominal pain. He describes it as a constant pain and has had this for about a year. It is in his right upper quadrant, radiates to right lower quadrant. He is able to have bowel movements, pass gas, tolerating meals. He also describes some dry heaving prior to admission to the hospital.  Objective: Vitals:   04/04/17 1730 04/04/17 1800 04/04/17 2005 04/05/17 0442  BP: (!) 161/76 (!) 146/78 (!) 146/76 124/64  Pulse: 62 66 70 (!) 57  Resp:   18 18  Temp:   98.6 F (37 C) 98.1 F (36.7 C)  TempSrc:   Oral Oral  SpO2:   94% 96%  Weight:      Height:        Intake/Output Summary (Last 24 hours) at 04/05/17 1110 Last data filed at 04/04/17 1922  Gross per 24 hour  Intake              555 ml  Output              200 ml  Net              355 ml   Filed Weights   04/03/17 1350 04/03/17 2007  Weight: 106.6 kg (235 lb) 105.2 kg (232 lb)    Examination:  General exam: Appears calm and comfortable  Respiratory system: Clear to auscultation. Respiratory effort normal. Cardiovascular system: S1 & S2 heard, RRR. No JVD, murmurs, rubs, gallops or clicks. No pedal edema. Gastrointestinal system: Abdomen is nondistended, soft and TTP RUQ,  RLQ. No organomegaly or masses felt. Normal bowel sounds heard. Central nervous system: Alert and oriented. No focal neurological deficits. Extremities: Symmetric 5 x 5 power. Skin: No rashes, lesions or ulcers Psychiatry: Judgement and insight appear normal. Mood & affect appropriate.   Data Reviewed: I have personally reviewed following labs and imaging studies  CBC:  Recent Labs Lab 04/03/17 1355 04/04/17 0618  WBC 10.5 12.4*  HGB 15.2 14.3  HCT 44.5 43.5  MCV 89.5 91.0  PLT 240 024   Basic Metabolic Panel:  Recent Labs Lab 04/03/17 1355 04/03/17 1815 04/04/17 0618 04/05/17 0401  NA 137  --  136 135  K 4.2  --  4.5 3.7  CL 106  --  104 101   CO2 21*  --  24 26  GLUCOSE 141*  --  111* 118*  BUN 23*  --  15 15  CREATININE 0.84  --  0.87 0.84  CALCIUM 9.3  --  8.9 8.9  MG  --  1.9  --   --    GFR: Estimated Creatinine Clearance: 105.4 mL/min (by C-G formula based on SCr of 0.84 mg/dL). Liver Function Tests:  Recent Labs Lab 04/03/17 1355 04/04/17 0618  AST 25 18  ALT 28 23  ALKPHOS 81 77  BILITOT 0.7 0.8  PROT 6.9 6.2*  ALBUMIN 4.1 3.7    Recent Labs Lab 04/03/17 1355 04/04/17 1226  LIPASE 20 12   No results for input(s): AMMONIA in the last 168 hours. Coagulation Profile:  Recent Labs Lab 04/04/17 0030  INR 1.01   Cardiac Enzymes:  Recent Labs Lab 04/03/17 1815 04/04/17 0030 04/04/17 0618  TROPONINI <0.03 <0.03 <0.03   BNP (last 3 results) No results for input(s): PROBNP in the last 8760 hours. HbA1C: No results for input(s): HGBA1C in the last 72 hours. CBG: No results for input(s): GLUCAP in the last 168 hours. Lipid Profile: No results for input(s): CHOL, HDL, LDLCALC, TRIG, CHOLHDL, LDLDIRECT in the last 72 hours. Thyroid Function Tests: No results for input(s): TSH, T4TOTAL, FREET4, T3FREE, THYROIDAB in the last 72 hours. Anemia Panel: No results for input(s): VITAMINB12, FOLATE, FERRITIN, TIBC, IRON, RETICCTPCT in the last 72 hours. Sepsis Labs: No results for input(s): PROCALCITON, LATICACIDVEN in the last 168 hours.  No results found for this or any previous visit (from the past 240 hour(s)).     Radiology Studies: Dg Chest Portable 1 View  Result Date: 04/03/2017 CLINICAL DATA:  Generalized chest pain with bilateral arm numbness as well as abdominal pain. EXAM: PORTABLE CHEST 1 VIEW COMPARISON:  Chest x-ray of August 31st 2009 FINDINGS: The lungs are adequately inflated and clear. The heart and pulmonary vascularity are normal. The mediastinum is normal in width. There is no pleural effusion. There is calcification in the wall of the aortic arch. The observed bony structures  are unremarkable. IMPRESSION: There is no acute cardiopulmonary abnormality. Thoracic aortic atherosclerosis. Electronically Signed   By: David  Martinique M.D.   On: 04/03/2017 14:05   Ct Angio Chest/abd/pel For Dissection W And/or Wo Contrast  Result Date: 04/03/2017 CLINICAL DATA:  66 year old male with pain all over and tingling in the arms. EXAM: CT ANGIOGRAPHY CHEST, ABDOMEN AND PELVIS TECHNIQUE: Multidetector CT imaging through the chest, abdomen and pelvis was performed using the standard protocol during bolus administration of intravenous contrast. Multiplanar reconstructed images and MIPs were obtained and reviewed to evaluate the vascular anatomy. Before administration of the IV contrast, a noncontrast chest CT was acquired. CONTRAST:  100 cc Isovue 370 COMPARISON:  Chest x-ray 04/03/2017 FINDINGS: CTA CHEST FINDINGS Cardiovascular: Heart: No cardiomegaly. No pericardial fluid/thickening. Calcifications of left main, left anterior descending, circumflex, right coronary arteries. Aorta: Diameter of ascending aorta measures 3.2 cm. No dissection. No periaortic fluid. Mild atherosclerotic changes of the aortic arch. Branch vessels are patent. Three vessel arch. Mild atherosclerotic changes of the descending thoracic aorta. No aneurysm. Pulmonary arteries: Timing of the contrast bolus not optimized for evaluation of pulmonary arteries, however, no lobar, segmental, or proximal subsegmental filling defects. Mediastinum/Nodes: Multiple calcified mediastinal lymph nodes, predominantly subcarinal in in the right hilum. Additional lymph nodes throughout all nodal stations which are not enlarged by CT size criteria. No comparison available. Unremarkable course of the esophagus.  Small hiatal hernia. Lungs/Pleura: Calcified granuloma at the right lung base. Atelectasis/scarring at the bilateral dependent lung bases. No confluent airspace disease. No pneumothorax. No pleural effusion. No pneumothorax.  Musculoskeletal: No displaced fracture. Degenerative changes of the spine. Review of the MIP images confirms the above findings. CTA ABDOMEN AND PELVIS FINDINGS VASCULAR Aorta: No dissection. No aneurysm. No periaortic fluid. Atherosclerotic changes of the abdominal aorta with mixed calcified and soft plaque. Celiac: No significant atherosclerotic changes at the origin of the celiac artery. Typical branch pattern. SMA: Atherosclerotic changes superior mesenteric artery without significant stenosis. Renals: Bilateral renal arteries with mild atherosclerotic changes and no significant stenosis. IMA: Inferior mesenteric artery is patent. Left colic artery patent. Superior rectal artery patent. Right lower extremity: Mild atherosclerotic changes of the right iliac system without aneurysm, dissection, or occlusion. External iliac artery patent. Hypogastric artery patent. Proximal right femoral vasculature patent. Left lower extremity: Atherosclerotic changes of the left iliac system without stenosis, occlusion, dissection, or aneurysm. Hypogastric artery is patent. Left external iliac artery patent. Proximal femoral vasculature patent. Veins: Unremarkable appearance of the venous system. Review of the MIP images confirms the above findings. NON-VASCULAR Hepatobiliary: Unremarkable appearance of the liver. Unremarkable gall bladder. Pancreas: Unremarkable appearance of the pancreas. No pericholecystic fluid or inflammatory changes. Unremarkable ductal system. Spleen: Punctate calcifications within splenic parenchyma. Adrenals/Urinary Tract: Unremarkable appearance of adrenal glands. Right: No hydronephrosis. Symmetric perfusion to the left. No nephrolithiasis. Unremarkable course of the right ureter. Left: No hydronephrosis. Symmetric perfusion to the right. No nephrolithiasis. Unremarkable course of the left ureter. Circumferential bladder wall thickening.  No inflammatory changes. Stomach/Bowel: Unremarkable appearance  of the stomach. Unremarkable appearance of small bowel. No evidence of obstruction. Colonic diverticular without evidence of acute diverticulitis. Normal appendix. Lymphatic: Multiple lymph nodes in the para-aortic nodal station, none of which are enlarged. Mesenteric: No free fluid or air. No adenopathy. Reproductive: Transverse diameter of the prostate measures 6.1 cm. Other: No hernia. Musculoskeletal: No acute fracture. Multilevel degenerative changes of the spine. No bony canal narrowing. Vacuum disc phenomenon at L1-L2 and L2-L3. Advanced degenerative disc disease at L5-S1. Mild bilateral degenerative changes of the hips. IMPRESSION: No CT evidence of acute aortic syndrome. No acute finding to account for the patient's symptoms. Enlarged prostate, with circumferential bladder wall thickening, potentially representing chronic bladder outlet obstruction. Aortic atherosclerosis, as well as left main and 3 vessel coronary artery disease. Evidence of prior granulomatous disease. Small hiatal hernia. Diverticular disease without evidence of acute diverticulitis. Signed, Dulcy Fanny. Earleen Newport, DO [facility] Radiology Electronically Signed   By: Corrie Mckusick D.O.   On: 04/03/2017 16:05   US Abdomen Limited Ruq  Result Date: 04/05/2017 CLINICAL DATA:  66 year old male with right upper quadrant abdominal  pain. EXAM: US ABDOMEN LIMITED - RIGHT UPPER QUADRANT COMPARISON:  None FINDINGS: Gallbladder: A 1 mm gallbladder polyp is noted. There is no evidence of cholelithiasis or acute cholecystitis. Common bile duct: Diameter: 2.1 mm. There is no evidence of intrahepatic or extrahepatic biliary dilatation. Liver: No focal lesion identified. Within normal limits in parenchymal echogenicity. IMPRESSION: Tiny gallbladder polyp without other abnormality. Electronically Signed   By: Margarette Canada M.D.   On: 04/05/2017 10:18      Scheduled Meds: . aspirin  81 mg Oral Daily  .  atorvastatin  80 mg Oral Daily  . buPROPion  300 mg Oral Daily  . chlorhexidine  15 mL Mouth/Throat BID  . citalopram  20 mg Oral Daily  . heparin  5,000 Units Subcutaneous Q8H  . ketoconazole   Topical Daily  . lisinopril  5 mg Oral Daily  . methylphenidate  36 mg Oral Daily  . metoprolol succinate  50 mg Oral Daily  . pantoprazole  40 mg Oral Daily  . sodium chloride flush  3 mL Intravenous Q12H   Continuous Infusions:    LOS: 0 days    Time spent: 30 minutes   Dessa Phi, DO [facility] [email or web address] Password [email or web address] 04/05/2017, 11:10 AM

## 2017-04-05 NOTE — Progress Notes (Signed)
Progress Note  Patient Name: Kenneth Henderson Date of Encounter: 04/05/2017 Shirline Frees, MD PCP Primary Cardiologist:   Dr. Aundra Dubin  Subjective   No chest pain.  Now pain has settled in his abdomen.    Inpatient Medications    Scheduled Meds: . aspirin  81 mg Oral Daily  . atorvastatin  80 mg Oral Daily  . buPROPion  300 mg Oral Daily  . chlorhexidine  15 mL Mouth/Throat BID  . citalopram  20 mg Oral Daily  . heparin  5,000 Units Subcutaneous Q8H  . ketoconazole   Topical Daily  . lisinopril  5 mg Oral Daily  . methylphenidate  36 mg Oral Daily  . metoprolol succinate  50 mg Oral Daily  . pantoprazole  40 mg Oral Daily  . sodium chloride flush  3 mL Intravenous Q12H   Continuous Infusions:  PRN Meds: sodium chloride, acetaminophen, ALPRAZolam, morphine injection, nitroGLYCERIN, ondansetron **OR** ondansetron (ZOFRAN) IV, oxyCODONE-acetaminophen, sodium chloride flush   Vital Signs    Vitals:   04/04/17 1730 04/04/17 1800 04/04/17 2005 04/05/17 0442  BP: (!) 161/76 (!) 146/78 (!) 146/76 124/64  Pulse: 62 66 70 (!) 57  Resp:   18 18  Temp:   98.6 F (37 C) 98.1 F (36.7 C)  TempSrc:   Oral Oral  SpO2:   94% 96%  Weight:      Height:        Intake/Output Summary (Last 24 hours) at 04/05/17 0852 Last data filed at 04/04/17 1922  Gross per 24 hour  Intake              555 ml  Output              200 ml  Net              355 ml   Filed Weights   04/03/17 1350 04/03/17 2007  Weight: 235 lb (106.6 kg) 232 lb (105.2 kg)    Telemetry    NSR, artifact - Personally Reviewed  ECG     - Personally Reviewed  Physical Exam   GEN: No acute distress.   Neck: No  JVD Cardiac: RRR, no murmurs, rubs, or gallops.  Respiratory: Clear  to auscultation bilaterally. GI: Mildly tender,non-distended  MS: No  edema; No deformity.  Cath access site without bleeding or bruising Neuro:  Nonfocal  Psych: Normal affect   Labs    Chemistry Recent Labs Lab  04/03/17 1355 04/04/17 0618 04/05/17 0401  NA 137 136 135  K 4.2 4.5 3.7  CL 106 104 101  CO2 21* 24 26  GLUCOSE 141* 111* 118*  BUN 23* 15 15  CREATININE 0.84 0.87 0.84  CALCIUM 9.3 8.9 8.9  PROT 6.9 6.2*  --   ALBUMIN 4.1 3.7  --   AST 25 18  --   ALT 28 23  --   ALKPHOS 81 77  --   BILITOT 0.7 0.8  --   GFRNONAA >60 >60 >60  GFRAA >60 >60 >60  ANIONGAP 10 8 8      Hematology Recent Labs Lab 04/03/17 1355 04/04/17 0618  WBC 10.5 12.4*  RBC 4.97 4.78  HGB 15.2 14.3  HCT 44.5 43.5  MCV 89.5 91.0  MCH 30.6 29.9  MCHC 34.2 32.9  RDW 13.1 13.4  PLT 240 208    Cardiac Enzymes Recent Labs Lab 04/03/17 1815 04/04/17 0030 04/04/17 0618  TROPONINI <0.03 <0.03 <0.03    Recent Labs Lab 04/03/17 1400  TROPIPOC 0.00     BNPNo results for input(s): BNP, PROBNP in the last 168 hours.   DDimer No results for input(s): DDIMER in the last 168 hours.   Radiology    Dg Chest Portable 1 View  Result Date: 04/03/2017 CLINICAL DATA:  Generalized chest pain with bilateral arm numbness as well as abdominal pain. EXAM: PORTABLE CHEST 1 VIEW COMPARISON:  Chest x-ray of August 31st 2009 FINDINGS: The lungs are adequately inflated and clear. The heart and pulmonary vascularity are normal. The mediastinum is normal in width. There is no pleural effusion. There is calcification in the wall of the aortic arch. The observed bony structures are unremarkable. IMPRESSION: There is no acute cardiopulmonary abnormality. Thoracic aortic atherosclerosis. Electronically Signed   By: David  Martinique M.D.   On: 04/03/2017 14:05   Ct Angio Chest/abd/pel For Dissection W And/or Wo Contrast  Result Date: 04/03/2017 CLINICAL DATA:  66 year old male with pain all over and tingling in the arms. EXAM: CT ANGIOGRAPHY CHEST, ABDOMEN AND PELVIS TECHNIQUE: Multidetector CT imaging through the chest, abdomen and pelvis was performed using the standard protocol during bolus administration of intravenous  contrast. Multiplanar reconstructed images and MIPs were obtained and reviewed to evaluate the vascular anatomy. Before administration of the IV contrast, a noncontrast chest CT was acquired. CONTRAST:  100 cc Isovue 370 COMPARISON:  Chest x-ray 04/03/2017 FINDINGS: CTA CHEST FINDINGS Cardiovascular: Heart: No cardiomegaly. No pericardial fluid/thickening. Calcifications of left main, left anterior descending, circumflex, right coronary arteries. Aorta: Diameter of ascending aorta measures 3.2 cm. No dissection. No periaortic fluid. Mild atherosclerotic changes of the aortic arch. Branch vessels are patent. Three vessel arch. Mild atherosclerotic changes of the descending thoracic aorta. No aneurysm. Pulmonary arteries: Timing of the contrast bolus not optimized for evaluation of pulmonary arteries, however, no lobar, segmental, or proximal subsegmental filling defects. Mediastinum/Nodes: Multiple calcified mediastinal lymph nodes, predominantly subcarinal in in the right hilum. Additional lymph nodes throughout all nodal stations which are not enlarged by CT size criteria. No comparison available. Unremarkable course of the esophagus.  Small hiatal hernia. Lungs/Pleura: Calcified granuloma at the right lung base. Atelectasis/scarring at the bilateral dependent lung bases. No confluent airspace disease. No pneumothorax. No pleural effusion. No pneumothorax. Musculoskeletal: No displaced fracture. Degenerative changes of the spine. Review of the MIP images confirms the above findings. CTA ABDOMEN AND PELVIS FINDINGS VASCULAR Aorta: No dissection. No aneurysm. No periaortic fluid. Atherosclerotic changes of the abdominal aorta with mixed calcified and soft plaque. Celiac: No significant atherosclerotic changes at the origin of the celiac artery. Typical branch pattern. SMA: Atherosclerotic changes superior mesenteric artery without significant stenosis. Renals: Bilateral renal arteries with mild atherosclerotic  changes and no significant stenosis. IMA: Inferior mesenteric artery is patent. Left colic artery patent. Superior rectal artery patent. Right lower extremity: Mild atherosclerotic changes of the right iliac system without aneurysm, dissection, or occlusion. External iliac artery patent. Hypogastric artery patent. Proximal right femoral vasculature patent. Left lower extremity: Atherosclerotic changes of the left iliac system without stenosis, occlusion, dissection, or aneurysm. Hypogastric artery is patent. Left external iliac artery patent. Proximal femoral vasculature patent. Veins: Unremarkable appearance of the venous system. Review of the MIP images confirms the above findings. NON-VASCULAR Hepatobiliary: Unremarkable appearance of the liver. Unremarkable gall bladder. Pancreas: Unremarkable appearance of the pancreas. No pericholecystic fluid or inflammatory changes. Unremarkable ductal system. Spleen: Punctate calcifications within splenic parenchyma. Adrenals/Urinary Tract: Unremarkable appearance of adrenal glands. Right: No hydronephrosis. Symmetric perfusion to the left. No nephrolithiasis. Unremarkable  course of the right ureter. Left: No hydronephrosis. Symmetric perfusion to the right. No nephrolithiasis. Unremarkable course of the left ureter. Circumferential bladder wall thickening.  No inflammatory changes. Stomach/Bowel: Unremarkable appearance of the stomach. Unremarkable appearance of small bowel. No evidence of obstruction. Colonic diverticular without evidence of acute diverticulitis. Normal appendix. Lymphatic: Multiple lymph nodes in the para-aortic nodal station, none of which are enlarged. Mesenteric: No free fluid or air. No adenopathy. Reproductive: Transverse diameter of the prostate measures 6.1 cm. Other: No hernia. Musculoskeletal: No acute fracture. Multilevel degenerative changes of the spine. No bony canal narrowing. Vacuum disc phenomenon at L1-L2 and L2-L3. Advanced degenerative  disc disease at L5-S1. Mild bilateral degenerative changes of the hips. IMPRESSION: No CT evidence of acute aortic syndrome. No acute finding to account for the patient's symptoms. Enlarged prostate, with circumferential bladder wall thickening, potentially representing chronic bladder outlet obstruction. Aortic atherosclerosis, as well as left main and 3 vessel coronary artery disease. Evidence of prior granulomatous disease. Small hiatal hernia. Diverticular disease without evidence of acute diverticulitis. Signed, Dulcy Fanny. Earleen Newport, DO [facility] Radiology Electronically Signed   By: Corrie Mckusick D.O.   On: 04/03/2017 16:05    Cardiac Studies   CATH:    Moderate mid LAD disease with 40-50% narrowing. First diagonal has eccentric 75% stenosis. The diagonal is small.  Dominant right coronary. Widely patent stent in the mid vessel.  No significant obstruction in the circumflex. Luminal irregularities are noted.  Overall normal LV function. Inferior wall hypokinesis. EF 50-55%.  Patient Profile     66 y.o. male who is being seen for chest painat the request of Dr. Tyrone Nine. He has a history of CAD with cath 06/2014 in setting of inferior MI showing occluded RCA and underwent PCI with DES to the RCA. EF at that time was 40-45% and echo 9/15 showed EF 60-65% with normal RV in [place]. Has been off Plavix since 01/10/16.   Assessment & Plan   CHEST PAIN:   No evidence of ischemia and CAD is stable as above.  No further cardiac work up or intervention.  Continue current therapy.   Cath site OK  ASCAD:  As above  HTN:    BP is well controlled.  Continue current therapy.   DYSLIPIDEMIA:   I don't see a recent lipid profile.  Will defer to Shirline Frees, MD to follow and for now continue Lipitor at current dose.   Signed, Minus Breeding, MD  04/05/2017, 8:52 AM

## 2017-04-07 ENCOUNTER — Encounter (HOSPITAL_COMMUNITY): Payer: Self-pay | Admitting: Interventional Cardiology

## 2017-04-07 DIAGNOSIS — M545 Low back pain: Secondary | ICD-10-CM | POA: Diagnosis not present

## 2017-04-07 DIAGNOSIS — R822 Biliuria: Secondary | ICD-10-CM | POA: Diagnosis not present

## 2017-04-07 DIAGNOSIS — R8290 Unspecified abnormal findings in urine: Secondary | ICD-10-CM | POA: Diagnosis not present

## 2017-04-07 DIAGNOSIS — R310 Gross hematuria: Secondary | ICD-10-CM | POA: Diagnosis not present

## 2017-04-08 ENCOUNTER — Ambulatory Visit
Admission: RE | Admit: 2017-04-08 | Discharge: 2017-04-08 | Disposition: A | Discharge status: Home / Self Care | Payer: Medicare Other | Source: Ambulatory Visit | Attending: Family Medicine | Admitting: Family Medicine

## 2017-04-08 ENCOUNTER — Other Ambulatory Visit: Payer: Self-pay | Admitting: Family Medicine

## 2017-04-08 DIAGNOSIS — D72829 Elevated white blood cell count, unspecified: Secondary | ICD-10-CM

## 2017-04-08 DIAGNOSIS — J9811 Atelectasis: Secondary | ICD-10-CM | POA: Diagnosis not present

## 2017-04-08 LAB — POCT ACTIVATED CLOTTING TIME: Activated Clotting Time: 147 seconds

## 2017-04-10 DIAGNOSIS — R8290 Unspecified abnormal findings in urine: Secondary | ICD-10-CM | POA: Diagnosis not present

## 2017-04-10 DIAGNOSIS — D72829 Elevated white blood cell count, unspecified: Secondary | ICD-10-CM | POA: Diagnosis not present

## 2017-04-15 DIAGNOSIS — R972 Elevated prostate specific antigen [PSA]: Secondary | ICD-10-CM | POA: Diagnosis not present

## 2017-04-15 DIAGNOSIS — R509 Fever, unspecified: Secondary | ICD-10-CM | POA: Diagnosis not present

## 2017-04-15 DIAGNOSIS — M2550 Pain in unspecified joint: Secondary | ICD-10-CM | POA: Diagnosis not present

## 2017-04-15 DIAGNOSIS — R1013 Epigastric pain: Secondary | ICD-10-CM | POA: Diagnosis not present

## 2017-04-16 DIAGNOSIS — R972 Elevated prostate specific antigen [PSA]: Secondary | ICD-10-CM | POA: Diagnosis not present

## 2017-04-16 DIAGNOSIS — R1011 Right upper quadrant pain: Secondary | ICD-10-CM | POA: Diagnosis not present

## 2017-04-16 DIAGNOSIS — R509 Fever, unspecified: Secondary | ICD-10-CM | POA: Diagnosis not present

## 2017-04-16 DIAGNOSIS — K219 Gastro-esophageal reflux disease without esophagitis: Secondary | ICD-10-CM | POA: Diagnosis not present

## 2017-04-16 DIAGNOSIS — Z86010 Personal history of colonic polyps: Secondary | ICD-10-CM | POA: Diagnosis not present

## 2017-04-16 DIAGNOSIS — R9341 Abnormal radiologic findings on diagnostic imaging of renal pelvis, ureter, or bladder: Secondary | ICD-10-CM | POA: Diagnosis not present

## 2017-04-16 DIAGNOSIS — M2550 Pain in unspecified joint: Secondary | ICD-10-CM | POA: Diagnosis not present

## 2017-04-22 DIAGNOSIS — K449 Diaphragmatic hernia without obstruction or gangrene: Secondary | ICD-10-CM | POA: Diagnosis not present

## 2017-04-22 DIAGNOSIS — K317 Polyp of stomach and duodenum: Secondary | ICD-10-CM | POA: Diagnosis not present

## 2017-04-22 DIAGNOSIS — K2930 Chronic superficial gastritis without bleeding: Secondary | ICD-10-CM | POA: Diagnosis not present

## 2017-04-22 DIAGNOSIS — R1011 Right upper quadrant pain: Secondary | ICD-10-CM | POA: Diagnosis not present

## 2017-04-25 DIAGNOSIS — K2930 Chronic superficial gastritis without bleeding: Secondary | ICD-10-CM | POA: Diagnosis not present

## 2017-07-04 DIAGNOSIS — Z85828 Personal history of other malignant neoplasm of skin: Secondary | ICD-10-CM | POA: Diagnosis not present

## 2017-07-04 DIAGNOSIS — D485 Neoplasm of uncertain behavior of skin: Secondary | ICD-10-CM | POA: Diagnosis not present

## 2017-07-04 DIAGNOSIS — C44329 Squamous cell carcinoma of skin of other parts of face: Secondary | ICD-10-CM | POA: Diagnosis not present

## 2017-07-04 DIAGNOSIS — L570 Actinic keratosis: Secondary | ICD-10-CM | POA: Diagnosis not present

## 2017-08-18 DIAGNOSIS — C44329 Squamous cell carcinoma of skin of other parts of face: Secondary | ICD-10-CM | POA: Diagnosis not present

## 2017-08-18 DIAGNOSIS — Z85828 Personal history of other malignant neoplasm of skin: Secondary | ICD-10-CM | POA: Diagnosis not present

## 2017-09-08 DIAGNOSIS — Z23 Encounter for immunization: Secondary | ICD-10-CM | POA: Diagnosis not present

## 2017-09-08 DIAGNOSIS — I2510 Atherosclerotic heart disease of native coronary artery without angina pectoris: Secondary | ICD-10-CM | POA: Diagnosis not present

## 2017-09-08 DIAGNOSIS — F324 Major depressive disorder, single episode, in partial remission: Secondary | ICD-10-CM | POA: Diagnosis not present

## 2017-09-08 DIAGNOSIS — E7800 Pure hypercholesterolemia, unspecified: Secondary | ICD-10-CM | POA: Diagnosis not present

## 2017-09-08 DIAGNOSIS — K219 Gastro-esophageal reflux disease without esophagitis: Secondary | ICD-10-CM | POA: Diagnosis not present

## 2017-09-08 DIAGNOSIS — I10 Essential (primary) hypertension: Secondary | ICD-10-CM | POA: Diagnosis not present

## 2017-09-24 DIAGNOSIS — D1801 Hemangioma of skin and subcutaneous tissue: Secondary | ICD-10-CM | POA: Diagnosis not present

## 2017-09-24 DIAGNOSIS — Z85828 Personal history of other malignant neoplasm of skin: Secondary | ICD-10-CM | POA: Diagnosis not present

## 2017-09-24 DIAGNOSIS — D225 Melanocytic nevi of trunk: Secondary | ICD-10-CM | POA: Diagnosis not present

## 2017-09-24 DIAGNOSIS — L814 Other melanin hyperpigmentation: Secondary | ICD-10-CM | POA: Diagnosis not present

## 2017-09-24 DIAGNOSIS — L821 Other seborrheic keratosis: Secondary | ICD-10-CM | POA: Diagnosis not present

## 2017-09-24 DIAGNOSIS — D2271 Melanocytic nevi of right lower limb, including hip: Secondary | ICD-10-CM | POA: Diagnosis not present

## 2017-09-24 DIAGNOSIS — L570 Actinic keratosis: Secondary | ICD-10-CM | POA: Diagnosis not present

## 2017-10-01 DIAGNOSIS — F900 Attention-deficit hyperactivity disorder, predominantly inattentive type: Secondary | ICD-10-CM | POA: Diagnosis not present

## 2018-03-09 DIAGNOSIS — Z1211 Encounter for screening for malignant neoplasm of colon: Secondary | ICD-10-CM | POA: Diagnosis not present

## 2018-03-09 DIAGNOSIS — I2510 Atherosclerotic heart disease of native coronary artery without angina pectoris: Secondary | ICD-10-CM | POA: Diagnosis not present

## 2018-03-09 DIAGNOSIS — F324 Major depressive disorder, single episode, in partial remission: Secondary | ICD-10-CM | POA: Diagnosis not present

## 2018-03-09 DIAGNOSIS — E291 Testicular hypofunction: Secondary | ICD-10-CM | POA: Diagnosis not present

## 2018-03-09 DIAGNOSIS — K219 Gastro-esophageal reflux disease without esophagitis: Secondary | ICD-10-CM | POA: Diagnosis not present

## 2018-03-09 DIAGNOSIS — E7800 Pure hypercholesterolemia, unspecified: Secondary | ICD-10-CM | POA: Diagnosis not present

## 2018-03-09 DIAGNOSIS — I10 Essential (primary) hypertension: Secondary | ICD-10-CM | POA: Diagnosis not present

## 2018-03-19 DIAGNOSIS — R739 Hyperglycemia, unspecified: Secondary | ICD-10-CM | POA: Diagnosis not present

## 2018-03-25 DIAGNOSIS — L821 Other seborrheic keratosis: Secondary | ICD-10-CM | POA: Diagnosis not present

## 2018-03-25 DIAGNOSIS — C44329 Squamous cell carcinoma of skin of other parts of face: Secondary | ICD-10-CM | POA: Diagnosis not present

## 2018-03-25 DIAGNOSIS — Q828 Other specified congenital malformations of skin: Secondary | ICD-10-CM | POA: Diagnosis not present

## 2018-03-25 DIAGNOSIS — Z85828 Personal history of other malignant neoplasm of skin: Secondary | ICD-10-CM | POA: Diagnosis not present

## 2018-03-25 DIAGNOSIS — D2271 Melanocytic nevi of right lower limb, including hip: Secondary | ICD-10-CM | POA: Diagnosis not present

## 2018-03-25 DIAGNOSIS — L565 Disseminated superficial actinic porokeratosis (DSAP): Secondary | ICD-10-CM | POA: Diagnosis not present

## 2018-03-25 DIAGNOSIS — I788 Other diseases of capillaries: Secondary | ICD-10-CM | POA: Diagnosis not present

## 2018-03-25 DIAGNOSIS — D1801 Hemangioma of skin and subcutaneous tissue: Secondary | ICD-10-CM | POA: Diagnosis not present

## 2018-03-25 DIAGNOSIS — L853 Xerosis cutis: Secondary | ICD-10-CM | POA: Diagnosis not present

## 2018-03-25 DIAGNOSIS — L820 Inflamed seborrheic keratosis: Secondary | ICD-10-CM | POA: Diagnosis not present

## 2018-03-25 DIAGNOSIS — L570 Actinic keratosis: Secondary | ICD-10-CM | POA: Diagnosis not present

## 2018-03-25 DIAGNOSIS — D2272 Melanocytic nevi of left lower limb, including hip: Secondary | ICD-10-CM | POA: Diagnosis not present

## 2018-03-25 DIAGNOSIS — D485 Neoplasm of uncertain behavior of skin: Secondary | ICD-10-CM | POA: Diagnosis not present

## 2018-03-30 DIAGNOSIS — F900 Attention-deficit hyperactivity disorder, predominantly inattentive type: Secondary | ICD-10-CM | POA: Diagnosis not present

## 2018-04-15 DIAGNOSIS — Z85828 Personal history of other malignant neoplasm of skin: Secondary | ICD-10-CM | POA: Diagnosis not present

## 2018-04-15 DIAGNOSIS — C44329 Squamous cell carcinoma of skin of other parts of face: Secondary | ICD-10-CM | POA: Diagnosis not present

## 2018-04-21 DIAGNOSIS — L570 Actinic keratosis: Secondary | ICD-10-CM | POA: Diagnosis not present

## 2018-04-21 DIAGNOSIS — D485 Neoplasm of uncertain behavior of skin: Secondary | ICD-10-CM | POA: Diagnosis not present

## 2018-06-20 IMAGING — CT CT ANGIO CHEST-ABD-PELV FOR DISSECTION W/ AND WO/W CM
2 of 8 series · 13 of 46 positions shown, 15 images · IV contrast (isovue)
Comparison: Chest x-ray 04/03/2017

CLINICAL DATA: 65-year-old male with pain all over and tingling in
the arms.

EXAM:
CT ANGIOGRAPHY CHEST, ABDOMEN AND PELVIS
TECHNIQUE: Multidetector CT imaging through the chest, abdomen and pelvis was
performed using the standard protocol during bolus administration of
intravenous contrast. Multiplanar reconstructed images and MIPs were
obtained and reviewed to evaluate the vascular anatomy. Before
administration of the IV contrast, a noncontrast chest CT was
acquired.
CONTRAST:  100 cc Isovue 370

[Series 4: dissection 3.0 i30f 3 · axial · 0.84mm/px · z∈[+654,+1212]mm · 10 of 228 slices shown, 12 images]
[im 21/228  soft-tissue]
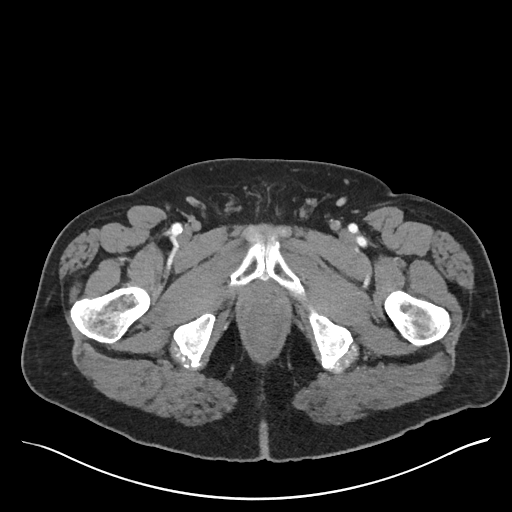
[im 21/228  bone]
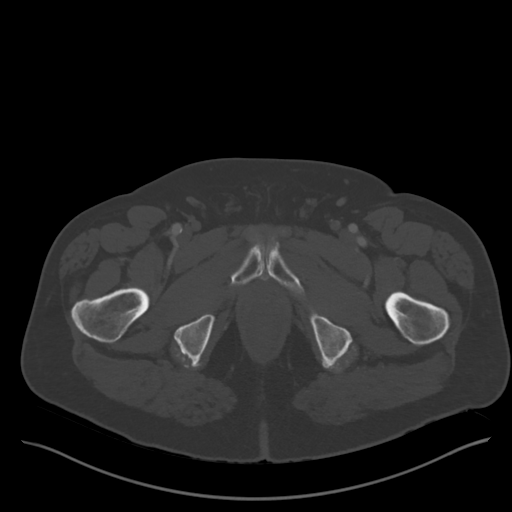
[im 42/228  soft-tissue]
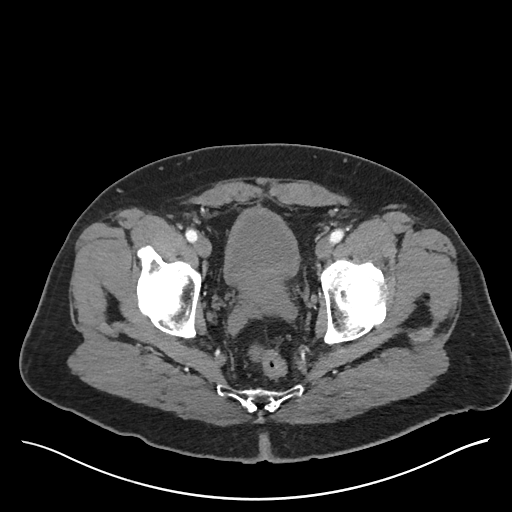
[im 62/228  soft-tissue]
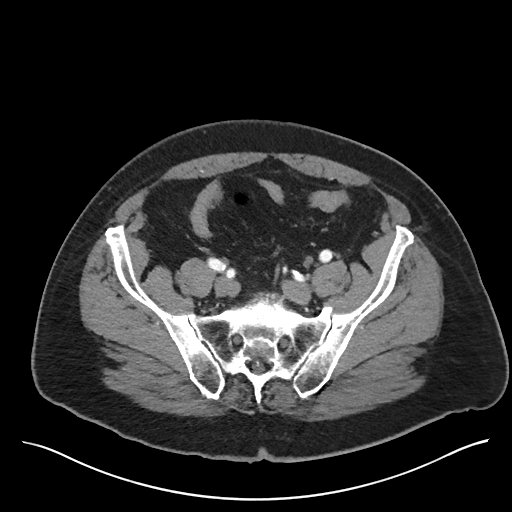
[im 83/228  soft-tissue]
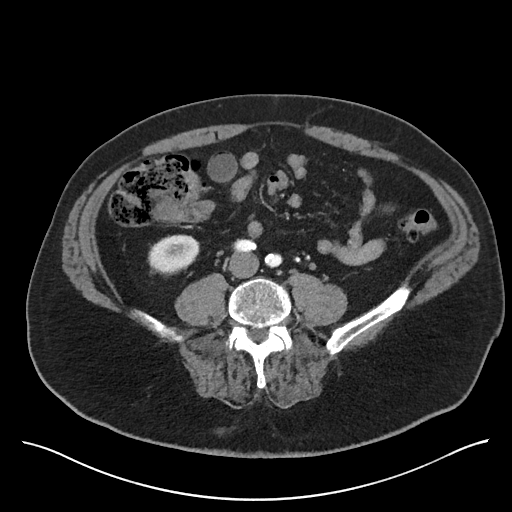
[im 104/228  soft-tissue]
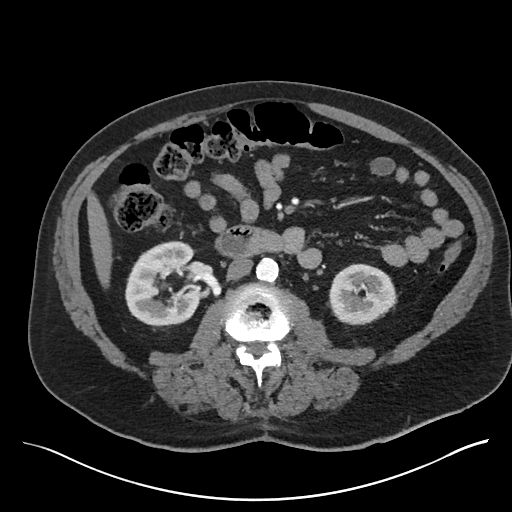
[im 124/228  soft-tissue]
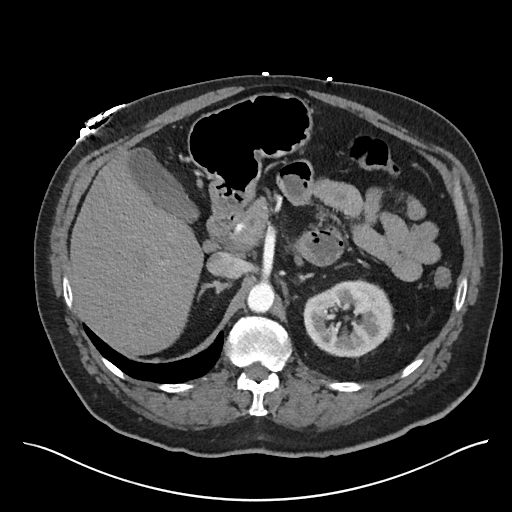
[im 145/228  soft-tissue]
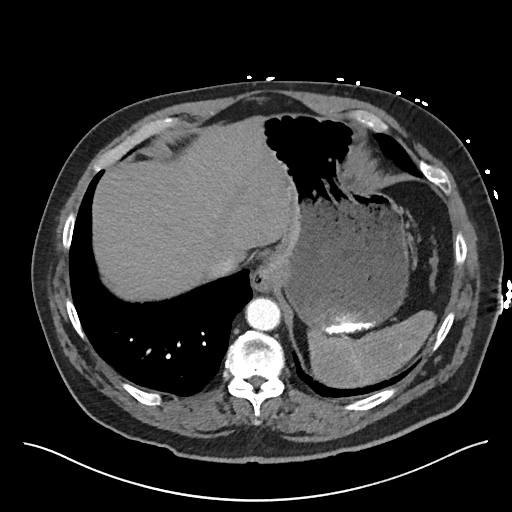
[im 166/228  soft-tissue]
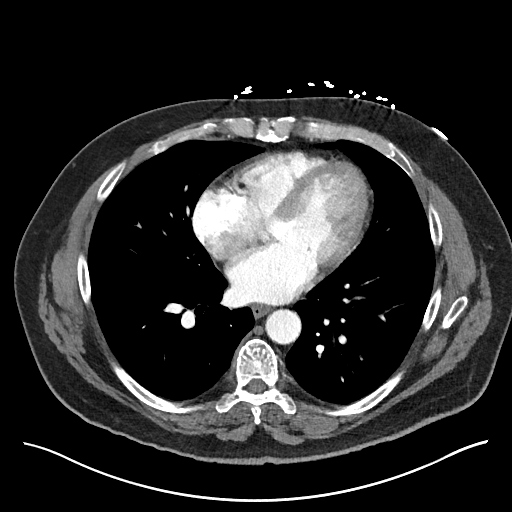
[im 186/228  soft-tissue]
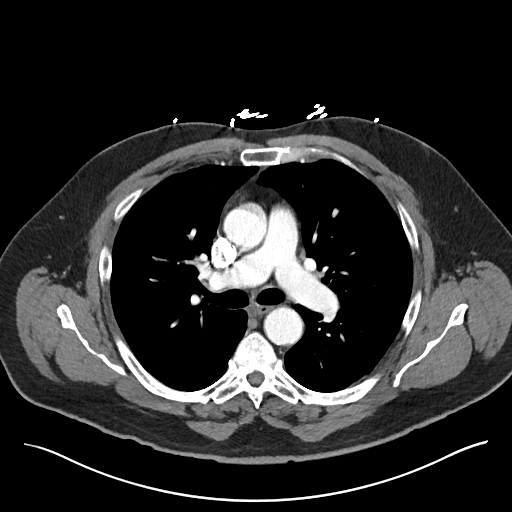
[im 186/228  bone]
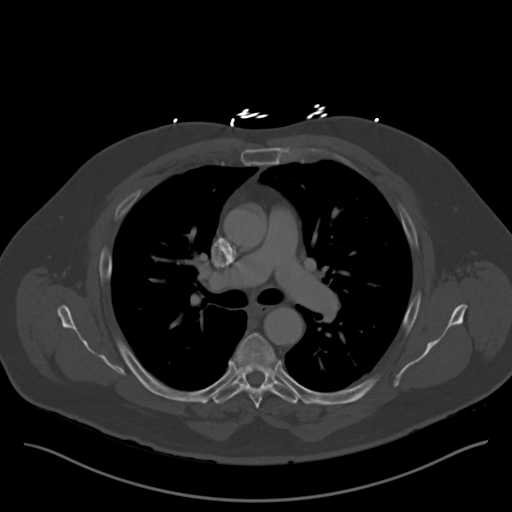
[im 207/228  soft-tissue]
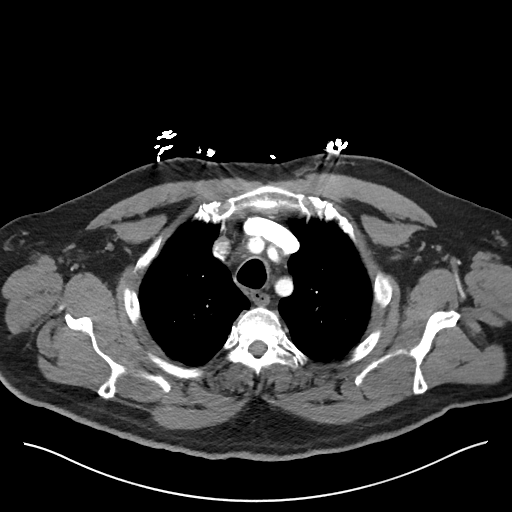

[Series 7: coronals · coronal · 1.00mm/px · 3 of 161 slices shown]
[im 41/161  soft-tissue]
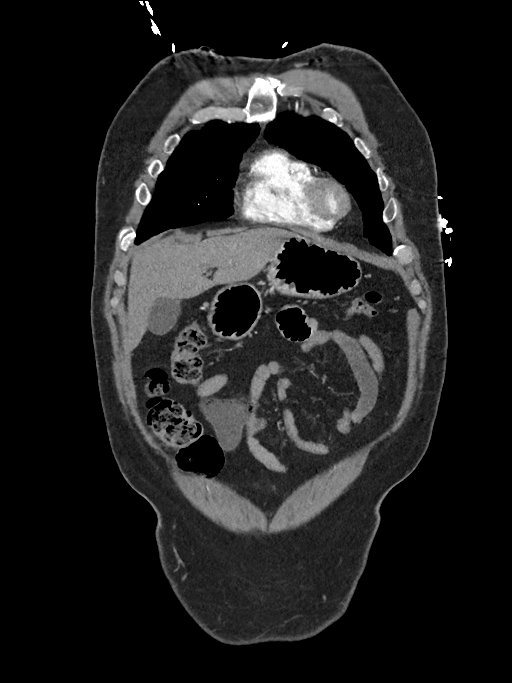
[im 81/161  soft-tissue]
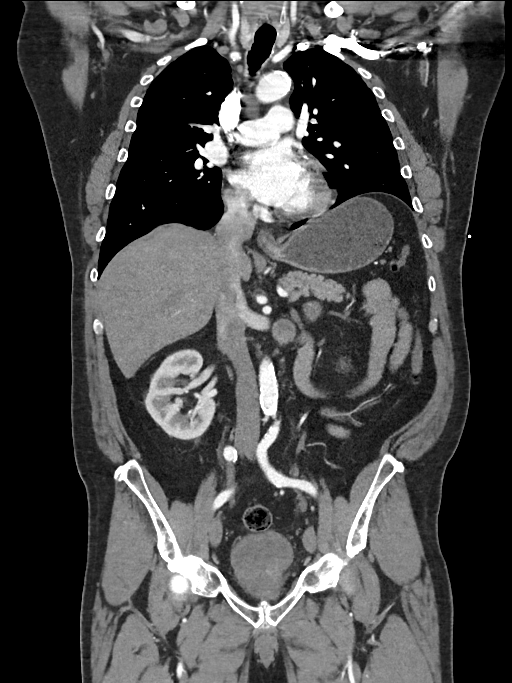
[im 121/161  soft-tissue]
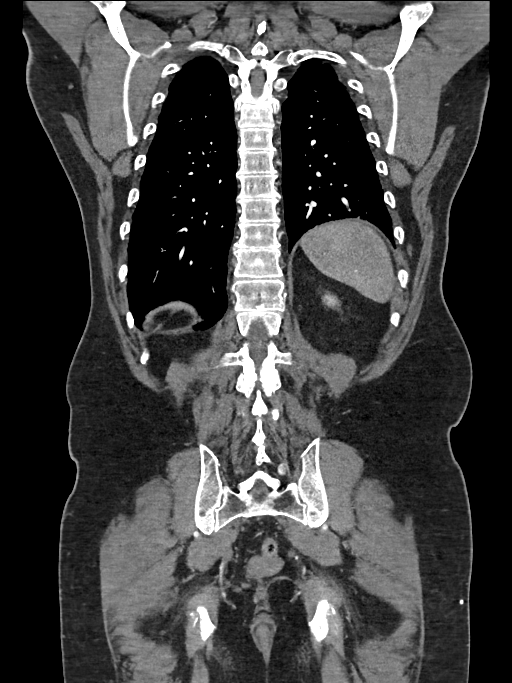

[13 of 46 positions shown; findings below may reference images not displayed]

FINDINGS: CTA CHEST FINDINGS

Cardiovascular:

Heart:

No cardiomegaly. No pericardial fluid/thickening. Calcifications of
left main, left anterior descending, circumflex, right coronary
arteries.

Aorta:

Diameter of ascending aorta measures 3.2 cm. No dissection. No
periaortic fluid. Mild atherosclerotic changes of the aortic arch.
Branch vessels are patent. Three vessel arch. Mild atherosclerotic
changes of the descending thoracic aorta. No aneurysm.

Pulmonary arteries:

Timing of the contrast bolus not optimized for evaluation of
pulmonary arteries, however, no lobar, segmental, or proximal
subsegmental filling defects.

Mediastinum/Nodes: Multiple calcified mediastinal lymph nodes,
predominantly subcarinal in in the right hilum. Additional lymph
nodes throughout all nodal stations which are not enlarged by CT
size criteria. No comparison available.

Unremarkable course of the esophagus.  Small hiatal hernia.

Lungs/Pleura: Calcified granuloma at the right lung base.
Atelectasis/scarring at the bilateral dependent lung bases. No
confluent airspace disease. No pneumothorax. No pleural effusion.

No pneumothorax.

Musculoskeletal: No displaced fracture. Degenerative changes of the
spine.

Review of the MIP images confirms the above findings.

CTA ABDOMEN AND PELVIS FINDINGS

VASCULAR

Aorta: No dissection. No aneurysm. No periaortic fluid.
Atherosclerotic changes of the abdominal aorta with mixed calcified
and soft plaque.

Celiac: No significant atherosclerotic changes at the origin of the
celiac artery. Typical branch pattern.

SMA: Atherosclerotic changes superior mesenteric artery without
significant stenosis.

Renals: Bilateral renal arteries with mild atherosclerotic changes
and no significant stenosis.

IMA: Inferior mesenteric artery is patent. Left colic artery patent.
Superior rectal artery patent.

Right lower extremity:

Mild atherosclerotic changes of the right iliac system without
aneurysm, dissection, or occlusion. External iliac artery patent.
Hypogastric artery patent. Proximal right femoral vasculature
patent.

Left lower extremity:

Atherosclerotic changes of the left iliac system without stenosis,
occlusion, dissection, or aneurysm. Hypogastric artery is patent.
Left external iliac artery patent. Proximal femoral vasculature
patent.

Veins: Unremarkable appearance of the venous system.

Review of the MIP images confirms the above findings.

NON-VASCULAR

Hepatobiliary: Unremarkable appearance of the liver. Unremarkable
gall bladder.

Pancreas: Unremarkable appearance of the pancreas. No
pericholecystic fluid or inflammatory changes. Unremarkable ductal
system.

Spleen: Punctate calcifications within splenic parenchyma.

Adrenals/Urinary Tract: Unremarkable appearance of adrenal glands.

Right:

No hydronephrosis. Symmetric perfusion to the left. No
nephrolithiasis. Unremarkable course of the right ureter.

Left:

No hydronephrosis. Symmetric perfusion to the right. No
nephrolithiasis. Unremarkable course of the left ureter.

Circumferential bladder wall thickening.  No inflammatory changes.

Stomach/Bowel: Unremarkable appearance of the stomach. Unremarkable
appearance of small bowel. No evidence of obstruction. Colonic
diverticular without evidence of acute diverticulitis. Normal
appendix.

Lymphatic: Multiple lymph nodes in the para-aortic nodal station,
none of which are enlarged.

Mesenteric: No free fluid or air. No adenopathy.

Reproductive: Transverse diameter of the prostate measures 6.1 cm.

Other: No hernia.

Musculoskeletal: No acute fracture. Multilevel degenerative changes
of the spine. No bony canal narrowing. Vacuum disc phenomenon at
L1-L2 and L2-L3. Advanced degenerative disc disease at L5-S1. Mild
bilateral degenerative changes of the hips.
IMPRESSION: No CT evidence of acute aortic syndrome.

No acute finding to account for the patient's symptoms.

Enlarged prostate, with circumferential bladder wall thickening,
potentially representing chronic bladder outlet obstruction.

Aortic atherosclerosis, as well as left main and 3 vessel coronary
artery disease.

Evidence of prior granulomatous disease.

Small hiatal hernia.

Diverticular disease without evidence of acute diverticulitis.

## 2018-06-20 IMAGING — DX DG CHEST 1V PORT
1 series · 1 of 1 positions shown · non-contrast
Comparison: Chest x-ray of [DATE]st 3442

CLINICAL DATA: Generalized chest pain with bilateral arm numbness
as well as abdominal pain.

EXAM:
PORTABLE CHEST 1 VIEW

[chest ap]
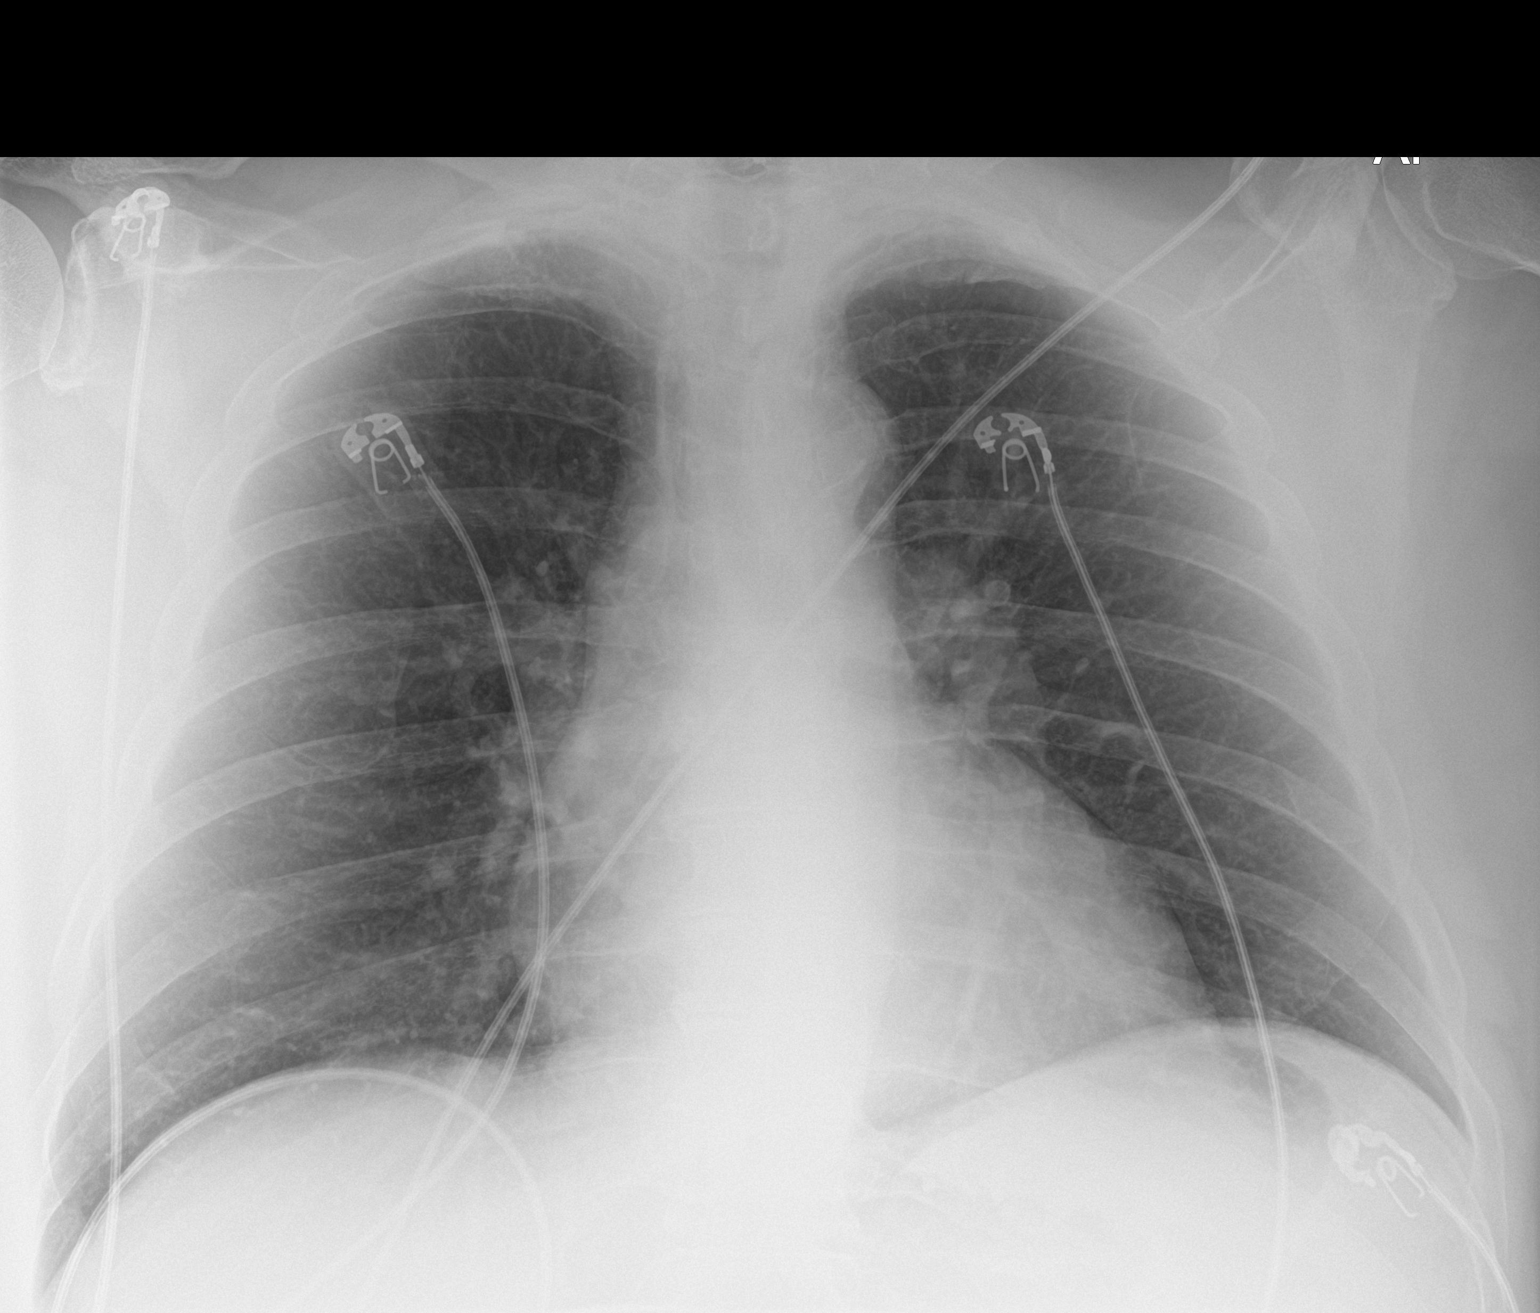

[1 of 1 positions shown; findings below may reference images not displayed]

FINDINGS: The lungs are adequately inflated and clear. The heart and pulmonary
vascularity are normal. The mediastinum is normal in width. There is
no pleural effusion. There is calcification in the wall of the
aortic arch. The observed bony structures are unremarkable.
IMPRESSION: There is no acute cardiopulmonary abnormality.

Thoracic aortic atherosclerosis.

## 2018-09-22 DIAGNOSIS — F900 Attention-deficit hyperactivity disorder, predominantly inattentive type: Secondary | ICD-10-CM | POA: Diagnosis not present

## 2018-09-30 DIAGNOSIS — L821 Other seborrheic keratosis: Secondary | ICD-10-CM | POA: Diagnosis not present

## 2018-09-30 DIAGNOSIS — L570 Actinic keratosis: Secondary | ICD-10-CM | POA: Diagnosis not present

## 2018-09-30 DIAGNOSIS — Z85828 Personal history of other malignant neoplasm of skin: Secondary | ICD-10-CM | POA: Diagnosis not present

## 2018-09-30 DIAGNOSIS — D1801 Hemangioma of skin and subcutaneous tissue: Secondary | ICD-10-CM | POA: Diagnosis not present

## 2018-09-30 DIAGNOSIS — Z85820 Personal history of malignant melanoma of skin: Secondary | ICD-10-CM | POA: Diagnosis not present

## 2018-09-30 DIAGNOSIS — D225 Melanocytic nevi of trunk: Secondary | ICD-10-CM | POA: Diagnosis not present

## 2018-09-30 DIAGNOSIS — D2271 Melanocytic nevi of right lower limb, including hip: Secondary | ICD-10-CM | POA: Diagnosis not present

## 2018-10-28 DIAGNOSIS — Z125 Encounter for screening for malignant neoplasm of prostate: Secondary | ICD-10-CM | POA: Diagnosis not present

## 2018-10-28 DIAGNOSIS — F324 Major depressive disorder, single episode, in partial remission: Secondary | ICD-10-CM | POA: Diagnosis not present

## 2018-10-28 DIAGNOSIS — Z1159 Encounter for screening for other viral diseases: Secondary | ICD-10-CM | POA: Diagnosis not present

## 2018-10-28 DIAGNOSIS — E291 Testicular hypofunction: Secondary | ICD-10-CM | POA: Diagnosis not present

## 2018-10-28 DIAGNOSIS — I10 Essential (primary) hypertension: Secondary | ICD-10-CM | POA: Diagnosis not present

## 2018-10-28 DIAGNOSIS — E7800 Pure hypercholesterolemia, unspecified: Secondary | ICD-10-CM | POA: Diagnosis not present

## 2018-10-28 DIAGNOSIS — M2550 Pain in unspecified joint: Secondary | ICD-10-CM | POA: Diagnosis not present

## 2018-10-28 DIAGNOSIS — K219 Gastro-esophageal reflux disease without esophagitis: Secondary | ICD-10-CM | POA: Diagnosis not present

## 2018-10-28 DIAGNOSIS — N400 Enlarged prostate without lower urinary tract symptoms: Secondary | ICD-10-CM | POA: Diagnosis not present

## 2018-10-28 DIAGNOSIS — R7303 Prediabetes: Secondary | ICD-10-CM | POA: Diagnosis not present

## 2018-10-28 DIAGNOSIS — Z0000 Encounter for general adult medical examination without abnormal findings: Secondary | ICD-10-CM | POA: Diagnosis not present

## 2018-10-28 DIAGNOSIS — Z23 Encounter for immunization: Secondary | ICD-10-CM | POA: Diagnosis not present

## 2018-10-29 DIAGNOSIS — R0789 Other chest pain: Secondary | ICD-10-CM | POA: Diagnosis not present

## 2018-10-29 DIAGNOSIS — I2510 Atherosclerotic heart disease of native coronary artery without angina pectoris: Secondary | ICD-10-CM | POA: Diagnosis not present

## 2018-10-29 DIAGNOSIS — K219 Gastro-esophageal reflux disease without esophagitis: Secondary | ICD-10-CM | POA: Diagnosis not present

## 2018-10-29 DIAGNOSIS — Z86010 Personal history of colonic polyps: Secondary | ICD-10-CM | POA: Diagnosis not present

## 2018-12-01 DIAGNOSIS — K5730 Diverticulosis of large intestine without perforation or abscess without bleeding: Secondary | ICD-10-CM | POA: Diagnosis not present

## 2018-12-01 DIAGNOSIS — K640 First degree hemorrhoids: Secondary | ICD-10-CM | POA: Diagnosis not present

## 2018-12-01 DIAGNOSIS — Z86010 Personal history of colonic polyps: Secondary | ICD-10-CM | POA: Diagnosis not present

## 2018-12-02 DIAGNOSIS — R972 Elevated prostate specific antigen [PSA]: Secondary | ICD-10-CM | POA: Diagnosis not present

## 2018-12-02 DIAGNOSIS — N5201 Erectile dysfunction due to arterial insufficiency: Secondary | ICD-10-CM | POA: Diagnosis not present

## 2018-12-03 ENCOUNTER — Other Ambulatory Visit: Payer: Self-pay | Admitting: Urology

## 2018-12-03 DIAGNOSIS — R972 Elevated prostate specific antigen [PSA]: Secondary | ICD-10-CM

## 2018-12-24 ENCOUNTER — Ambulatory Visit
Admission: RE | Admit: 2018-12-24 | Discharge: 2018-12-24 | Disposition: A | Discharge status: Home / Self Care | Payer: Medicare Other | Source: Ambulatory Visit | Attending: Urology | Admitting: Urology

## 2018-12-24 DIAGNOSIS — R972 Elevated prostate specific antigen [PSA]: Secondary | ICD-10-CM | POA: Diagnosis not present

## 2018-12-24 MED ORDER — GADOBENATE DIMEGLUMINE 529 MG/ML IV SOLN
20.0000 mL | Freq: Once | INTRAVENOUS | Status: AC | PRN
  Administered 2018-12-24: 20 mL via INTRAVENOUS

## 2019-01-06 DIAGNOSIS — L570 Actinic keratosis: Secondary | ICD-10-CM | POA: Diagnosis not present

## 2019-01-06 DIAGNOSIS — Z85828 Personal history of other malignant neoplasm of skin: Secondary | ICD-10-CM | POA: Diagnosis not present

## 2019-01-06 DIAGNOSIS — L820 Inflamed seborrheic keratosis: Secondary | ICD-10-CM | POA: Diagnosis not present

## 2019-01-25 DIAGNOSIS — R972 Elevated prostate specific antigen [PSA]: Secondary | ICD-10-CM | POA: Diagnosis not present

## 2019-01-25 DIAGNOSIS — N5201 Erectile dysfunction due to arterial insufficiency: Secondary | ICD-10-CM | POA: Diagnosis not present

## 2019-01-25 DIAGNOSIS — R3915 Urgency of urination: Secondary | ICD-10-CM | POA: Diagnosis not present

## 2019-01-25 DIAGNOSIS — N401 Enlarged prostate with lower urinary tract symptoms: Secondary | ICD-10-CM | POA: Diagnosis not present

## 2019-04-28 DIAGNOSIS — F324 Major depressive disorder, single episode, in partial remission: Secondary | ICD-10-CM | POA: Diagnosis not present

## 2019-04-28 DIAGNOSIS — I2510 Atherosclerotic heart disease of native coronary artery without angina pectoris: Secondary | ICD-10-CM | POA: Diagnosis not present

## 2019-04-28 DIAGNOSIS — E7800 Pure hypercholesterolemia, unspecified: Secondary | ICD-10-CM | POA: Diagnosis not present

## 2019-04-28 DIAGNOSIS — K219 Gastro-esophageal reflux disease without esophagitis: Secondary | ICD-10-CM | POA: Diagnosis not present

## 2019-04-28 DIAGNOSIS — I10 Essential (primary) hypertension: Secondary | ICD-10-CM | POA: Diagnosis not present

## 2019-06-15 DIAGNOSIS — M4722 Other spondylosis with radiculopathy, cervical region: Secondary | ICD-10-CM | POA: Diagnosis not present

## 2019-06-15 DIAGNOSIS — M50122 Cervical disc disorder at C5-C6 level with radiculopathy: Secondary | ICD-10-CM | POA: Diagnosis not present

## 2019-06-15 DIAGNOSIS — M50123 Cervical disc disorder at C6-C7 level with radiculopathy: Secondary | ICD-10-CM | POA: Diagnosis not present

## 2019-06-15 DIAGNOSIS — M542 Cervicalgia: Secondary | ICD-10-CM | POA: Diagnosis not present

## 2019-06-15 DIAGNOSIS — Z6833 Body mass index (BMI) 33.0-33.9, adult: Secondary | ICD-10-CM | POA: Diagnosis not present

## 2019-06-18 ENCOUNTER — Other Ambulatory Visit: Payer: Self-pay | Admitting: Orthopedic Surgery

## 2019-06-18 DIAGNOSIS — M4722 Other spondylosis with radiculopathy, cervical region: Secondary | ICD-10-CM

## 2019-07-14 ENCOUNTER — Ambulatory Visit
Admission: RE | Admit: 2019-07-14 | Discharge: 2019-07-14 | Disposition: A | Discharge status: Home / Self Care | Payer: Medicare Other | Source: Ambulatory Visit | Attending: Orthopedic Surgery | Admitting: Orthopedic Surgery

## 2019-07-14 DIAGNOSIS — L738 Other specified follicular disorders: Secondary | ICD-10-CM | POA: Diagnosis not present

## 2019-07-14 DIAGNOSIS — L570 Actinic keratosis: Secondary | ICD-10-CM | POA: Diagnosis not present

## 2019-07-14 DIAGNOSIS — M4722 Other spondylosis with radiculopathy, cervical region: Secondary | ICD-10-CM

## 2019-07-14 DIAGNOSIS — D485 Neoplasm of uncertain behavior of skin: Secondary | ICD-10-CM | POA: Diagnosis not present

## 2019-07-14 DIAGNOSIS — D045 Carcinoma in situ of skin of trunk: Secondary | ICD-10-CM | POA: Diagnosis not present

## 2019-07-14 DIAGNOSIS — D225 Melanocytic nevi of trunk: Secondary | ICD-10-CM | POA: Diagnosis not present

## 2019-07-14 DIAGNOSIS — M4802 Spinal stenosis, cervical region: Secondary | ICD-10-CM | POA: Diagnosis not present

## 2019-07-14 DIAGNOSIS — L814 Other melanin hyperpigmentation: Secondary | ICD-10-CM | POA: Diagnosis not present

## 2019-07-14 DIAGNOSIS — Z85828 Personal history of other malignant neoplasm of skin: Secondary | ICD-10-CM | POA: Diagnosis not present

## 2019-07-14 DIAGNOSIS — L821 Other seborrheic keratosis: Secondary | ICD-10-CM | POA: Diagnosis not present

## 2019-07-14 DIAGNOSIS — D1801 Hemangioma of skin and subcutaneous tissue: Secondary | ICD-10-CM | POA: Diagnosis not present

## 2019-07-20 DIAGNOSIS — M4722 Other spondylosis with radiculopathy, cervical region: Secondary | ICD-10-CM | POA: Diagnosis not present

## 2019-07-20 DIAGNOSIS — M4712 Other spondylosis with myelopathy, cervical region: Secondary | ICD-10-CM | POA: Diagnosis not present

## 2019-07-20 DIAGNOSIS — M25511 Pain in right shoulder: Secondary | ICD-10-CM | POA: Diagnosis not present

## 2019-07-20 DIAGNOSIS — M4802 Spinal stenosis, cervical region: Secondary | ICD-10-CM | POA: Diagnosis not present

## 2019-07-20 DIAGNOSIS — M50122 Cervical disc disorder at C5-C6 level with radiculopathy: Secondary | ICD-10-CM | POA: Diagnosis not present

## 2019-07-20 DIAGNOSIS — M25512 Pain in left shoulder: Secondary | ICD-10-CM | POA: Diagnosis not present

## 2019-08-04 ENCOUNTER — Telehealth (HOSPITAL_COMMUNITY): Payer: Self-pay

## 2019-08-04 NOTE — Telephone Encounter (Signed)
Received cardiac clearance request from [facility].  Clearance completed by MD and faxed back.  Pt have not been seen in 3 years.  He requires visit with Dr Aundra Dubin or [facility] pre op clinic for clearance. Clearance form faxed with this information.  Confirmation received.

## 2019-08-05 DIAGNOSIS — I10 Essential (primary) hypertension: Secondary | ICD-10-CM | POA: Diagnosis not present

## 2019-08-11 NOTE — Telephone Encounter (Signed)
Pt scheduled with Dr.Schuman at the [facility] office 9/8 at 320pm. Pt aware.

## 2019-08-12 DIAGNOSIS — F900 Attention-deficit hyperactivity disorder, predominantly inattentive type: Secondary | ICD-10-CM | POA: Diagnosis not present

## 2019-08-15 NOTE — Progress Notes (Signed)
Cardiology Office Note:   Date:  08/16/2019  NAME:  Kenneth Henderson    MRN: [id number] DOB:  [birth year]   PCP:  Shirline Frees, MD  Cardiologist:  Evalina Field, MD  Electrophysiologist:  None   Referring MD: Shirline Frees, MD   Chief Complaint  Patient presents with  . Pre-op Exam   History of Present Illness:   Kenneth Henderson is a 68 y.o. male with a hx of HTN, CAD (inferior MI s/p PCI to RCA 2015, EF 50-55%) who is being seen today for the evaluation of preoperative assessment at the request of Kenneth Henderson.  Kenneth Henderson presents for preoperative assessment prior to cervical spine surgery.  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  He reports he is able to run up a flight of stairs, including multiples without any chest pain, shortness of breath, or limitations.  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  His most recent heart cath was in 2018, and was unchanged from previously.  No intervention was pursued that time. He continues to do quite well.  Is well controlled today, and most recent LDL was noted to be 112.,  A1c 5.8.  His ECG is unremarkable, and overall he is doing well.  He denies chest pain, shortness of breath, lower extremity edema, PND, orthopnea.  Past Medical History: Past Medical History:  Diagnosis Date  . Actinic keratosis treated with topical fluorouracil (5FU)   . Anxiety   . Arthritis    "mostly in my shoulders; spine" (04/03/2017)  . Attention deficit disorder (ADD) in adult   . Basal cell carcinoma   . Coronary artery disease     CAD with cath 06/2014 in setting of inferior MI showing occluded RCA and underwent PCI with DES to the RCA.  EF at that time was 40-45% and echo 9/15 showed EF 60-65% with normal RV in [place]  . Depression   . GERD (gastroesophageal reflux disease)   . Hyperlipidemia LDL goal <70   . Hypertension   . Inferior MI ([place]) 06/2014   Archie Endo 04/03/2017  . Melanoma of forearm, left ([place])   . Sleep apnea    "never took test or wore mask" (04/03/2017)  . Squamous carcinoma     Past Surgical History: Past Surgical History:  Procedure Laterality Date  . BACK SURGERY    . CORONARY ANGIOPLASTY WITH STENT PLACEMENT  06/2014   with 100 % occlusion to the RCA, S/P PCI with DES/notes 04/03/2017  . DENTAL SURGERY  "several"  . INGUINAL HERNIA REPAIR Bilateral ~ 2013  . LEFT HEART CATH AND CORONARY ANGIOGRAPHY N/A 04/04/2017   Procedure: Left Heart Cath and Coronary Angiography;  Surgeon: Belva Crome, MD;  Location: [place] [facility];  Service: Cardiovascular;  Laterality: N/A;  . [place]   "ruptured disc"  . LUMBAR LAMINECTOMY  12/2015   L1-3/notes 01/17/2016  . MELANOMA EXCISION Left    "forehead"  . MOHS SURGERY  X3   "some basal; some squamous"  . NASAL SINUS SURGERY  1980s?  . PROSTATE BIOPSY      Current Medications: Current Meds  Medication Sig  . aspirin 81 MG tablet Take 81 mg by mouth daily.  Marland Kitchen atorvastatin (LIPITOR) 80 MG tablet Take 80 mg by mouth daily.  Marland Kitchen buPROPion (WELLBUTRIN XL) 300 MG 24 hr tablet Take 300 mg by mouth daily.   Marland Kitchen  chlorhexidine (PERIDEX) 0.12 % solution RINSE MOUTH WITH 15ML (1 CAPFUL) FOR 30 SECONDS am AND PM AFTER TOOTHBRUSHING. EXPECTORATE AFTER RINSING, DO NOT SWALLOW  . citalopram (CELEXA) 20 MG tablet Take 1 tablet (20 mg total) by mouth daily.  Marland Kitchen esomeprazole (NEXIUM) 40 MG capsule Take 40 mg by mouth daily at 12 noon.  . fluorouracil (EFUDEX) 5 % cream APPLY TO AFFECTED AREA OF SKIN 2 TIMES DAILY AS DIRECTED FOR 2 WEEKS  . GLUCOSAMINE-CHONDROITIN PO Take 1 tablet by mouth daily.  Marland Kitchen ketoconazole (NIZORAL) 2 % cream APPLY TO AFFECTED AREA 2 TIMES DAILY FOR 2 WEEKS  . lisinopril (ZESTRIL) 10 MG tablet   . methylphenidate (METADATE CD) 40 MG CR capsule Take 40 mg by mouth every morning.  . metoprolol succinate (TOPROL-XL) 50 MG 24 hr tablet Take 50 mg by mouth  daily. Take with or immediately following a meal.  . Naftifine HCl (NAFTIN) 2 % CREA Apply 1 application topically 1 day or 1 dose.  Marland Kitchen NITROSTAT 0.4 MG SL tablet Place 0.4 mg under the tongue every 5 (five) minutes as needed for chest pain.   . Omega-3 Fatty Acids (FISH OIL) 1000 MG CAPS Take 1,000 mg by mouth daily.  Marland Kitchen PREVIDENT 5000 SENSITIVE 1.1-5 % PSTE BRUSH ON teeth 2 TIMES DAILY FOR 2 minutes. spit out as much possible. DO not SWISH, eat, OR drink FOR 30 minutes  . [DISCONTINUED] lisinopril (PRINIVIL,ZESTRIL) 5 MG tablet Take 5 mg by mouth daily.     Allergies:    Patient has no known allergies.   Social History: Social History   Socioeconomic History  . Marital status: Married    Spouse name: Not on file  . Number of children: Not on file  . Years of education: Not on file  . Highest education level: Not on file  Occupational History  . Not on file  Social Needs  . Financial resource strain: Not on file  . Food insecurity    Worry: Not on file    Inability: Not on file  . Transportation needs    Medical: Not on file    Non-medical: Not on file  Tobacco Use  . Smoking status: Former Smoker    Packs/day: 1.00    Years: 15.00    Pack years: 15.00    Types: Cigarettes  . Smokeless tobacco: Never Used  . Tobacco comment: 04/03/2017 "quit smoking in the 1980s; chewed some in high school"  Substance and Sexual Activity  . Alcohol use: Yes    Comment: 04/03/2017 "glass of wine/month; if that"  . Drug use: No  . Sexual activity: Not Currently  Lifestyle  . Physical activity    Days per week: Not on file    Minutes per session: Not on file  . Stress: Not on file  Relationships  . Social Herbalist on phone: Not on file    Gets together: Not on file    Attends religious service: Not on file    Active member of club or organization: Not on file    Attends meetings of clubs or organizations: Not on file    Relationship status: Not on file  Other Topics  Concern  . Not on file  Social History Narrative  . Not on file     Family History: The patient's family history includes Alzheimer's disease in his father; CAD in his brother and brother; Cancer in his sister; Healthy in his brother; Heart attack (age of onset: 54)  in his brother; Heart failure in his mother; Melanoma in his brother.  ROS:   All other ROS reviewed and negative. Pertinent positives noted in the HPI.     EKGs/Labs/Other Studies Reviewed:   The following studies were personally reviewed by me today: LDL 112, A1c 5.8, HDL 45, total cholesterol 174, TSH 2.52 (labs obtained at PCP 2019)  Cardiac Cat (03/2017)  Moderate mid LAD disease with 40-50% narrowing. First diagonal has eccentric 75% stenosis. The diagonal is small.  Dominant right coronary. Widely patent stent in the mid vessel.  No significant obstruction in the circumflex. Luminal irregularities are noted.  Overall normal LV function. Inferior wall hypokinesis. EF 50-55%.  EKG:  EKG is ordered today.  The ekg ordered today demonstrates sinus bradycardia, normal intervals, no acute ST-T changes to suggest ischemia, no prior infarction, and was personally reviewed by me.   Recent Labs: No results found for requested labs within last 8760 hours.   Recent Lipid Panel    Component Value Date/Time   CHOL 128 09/06/2014 0838   TRIG 97 09/06/2014 0838   HDL 31 (L) 09/06/2014 0838   CHOLHDL 4.1 09/06/2014 0838   VLDL 19 09/06/2014 0838   LDLCALC 78 09/06/2014 0838    Physical Exam:   VS:  BP 132/74   Pulse (!) 58   Ht 5\' 11"  (1.803 m)   Wt 240 lb (108.9 kg)   SpO2 96%   BMI 33.47 kg/m    Wt Readings from Last 3 Encounters:  08/16/19 240 lb (108.9 kg)  04/03/17 232 lb (105.2 kg)  01/15/16 243 lb 4 oz (110.3 kg)    General: Well nourished, well developed, in no acute distress Heart: Atraumatic, normal size  Eyes: PEERLA, EOMI  Neck: Supple, no JVD Endocrine: No thryomegaly Cardiac: Normal S1, S2;  RRR; no murmurs, rubs, or gallops Lungs: Clear to auscultation bilaterally, no wheezing, rhonchi or rales  Abd: Soft, nontender, no hepatomegaly  Ext: No edema, pulses 2+ Musculoskeletal: No deformities, BUE and BLE strength normal and equal Skin: Warm and dry, no rashes   Neuro: Alert and oriented to person, place, time, and situation, CNII-XII grossly intact, no focal deficits  Psych: Normal mood and affect   ASSESSMENT:   NAME@ is a 68 y.o. male who presents for the following: 1. Coronary artery disease involving native coronary artery of native heart without angina pectoris   2. Essential hypertension   3. Preoperative cardiovascular examination     PLAN:   1. Coronary artery disease involving native coronary artery of native heart without angina pectoris 2. Essential hypertension 3. Preoperative cardiovascular examination - Preoperative Risk Assessment - The Revised Cardiac Risk Index = 1 (CAD), which equates to 0.9% estimated risk of perioperative myocardial infarction, pulmonary edema, ventricular fibrillation, cardiac arrest, or complete heart block.  - No further cardiac testing is recommended prior to surgery.  - The patient may proceed to surgery at acceptable risk.   -We will see him back on a yearly basis due to the fact that he is doing exceptionally well from a cardiovascular standpoint -He will continue taking his aspirin and statin -He was given instructions and will receive further instructions on holding his aspirin prior to his planned surgery  Disposition: Return in about 1 year (around 08/15/2020).  Medication Adjustments/Labs and Tests Ordered: Current medicines are reviewed at length with the patient today.  Concerns regarding medicines are outlined above.  Orders Placed This Encounter  Procedures  . EKG 12-Lead  No orders of the defined types were placed in this encounter.   Patient Instructions  Medication Instructions:   No changes  If you need a  refill on your cardiac medications before your next appointment, please call your pharmacy.   Lab work: Not needed  Testing/Procedures: Not needed  Follow-Up: At [facility], you and your health needs are our priority.  As part of our continuing mission to provide you with exceptional heart care, we have created designated Provider Care Teams.  These Care Teams include your primary Cardiologist (physician) and Advanced Practice Providers (APPs -  Physician Assistants and Nurse Practitioners) who all work together to provide you with the care you need, when you need it. . Dr Audie Box recommends that you schedule a follow-up appointment in 12 months -Aug 2021     Any Other Special Instructions Will Be Listed Below.  Clearance for spine surgery -  By Dr Sherlyn Lick Letter faxed and given to you    Signed, [place] T. Audie Box, [place]  [address], [place], [place] [address] [phone]  08/16/2019 1:12 PM

## 2019-08-16 ENCOUNTER — Ambulatory Visit (INDEPENDENT_AMBULATORY_CARE_PROVIDER_SITE_OTHER): Payer: Medicare Other | Admitting: Cardiovascular Disease

## 2019-08-16 ENCOUNTER — Encounter: Payer: Self-pay | Admitting: Cardiovascular Disease

## 2019-08-16 ENCOUNTER — Other Ambulatory Visit: Payer: Self-pay

## 2019-08-16 VITALS — BP 132/74 | HR 58 | Ht 71.0 in | Wt 240.0 lb

## 2019-08-16 DIAGNOSIS — I2510 Atherosclerotic heart disease of native coronary artery without angina pectoris: Secondary | ICD-10-CM

## 2019-08-16 DIAGNOSIS — I10 Essential (primary) hypertension: Secondary | ICD-10-CM | POA: Diagnosis not present

## 2019-08-16 DIAGNOSIS — Z01810 Encounter for preprocedural cardiovascular examination: Secondary | ICD-10-CM

## 2019-08-16 NOTE — Patient Instructions (Addendum)
Medication Instructions:   No changes  If you need a refill on your cardiac medications before your next appointment, please call your pharmacy.   Lab work: Not needed  Testing/Procedures: Not needed  Follow-Up: At [facility], you and your health needs are our priority.  As part of our continuing mission to provide you with exceptional heart care, we have created designated Provider Care Teams.  These Care Teams include your primary Cardiologist (physician) and Advanced Practice Providers (APPs -  Physician Assistants and Nurse Practitioners) who all work together to provide you with the care you need, when you need it. . Dr Audie Box recommends that you schedule a follow-up appointment in 12 months -Aug 2021     Any Other Special Instructions Will Be Listed Below.  Clearance for spine surgery -  By Dr Sherlyn Lick Letter faxed and given to you

## 2019-08-20 DIAGNOSIS — I2510 Atherosclerotic heart disease of native coronary artery without angina pectoris: Secondary | ICD-10-CM | POA: Diagnosis not present

## 2019-08-20 DIAGNOSIS — F324 Major depressive disorder, single episode, in partial remission: Secondary | ICD-10-CM | POA: Diagnosis not present

## 2019-08-20 DIAGNOSIS — I10 Essential (primary) hypertension: Secondary | ICD-10-CM | POA: Diagnosis not present

## 2019-08-20 DIAGNOSIS — E7800 Pure hypercholesterolemia, unspecified: Secondary | ICD-10-CM | POA: Diagnosis not present

## 2019-08-20 DIAGNOSIS — N400 Enlarged prostate without lower urinary tract symptoms: Secondary | ICD-10-CM | POA: Diagnosis not present

## 2019-08-24 DIAGNOSIS — M4722 Other spondylosis with radiculopathy, cervical region: Secondary | ICD-10-CM | POA: Diagnosis not present

## 2019-08-24 DIAGNOSIS — M4802 Spinal stenosis, cervical region: Secondary | ICD-10-CM | POA: Diagnosis not present

## 2019-08-24 DIAGNOSIS — M50122 Cervical disc disorder at C5-C6 level with radiculopathy: Secondary | ICD-10-CM | POA: Diagnosis not present

## 2019-08-24 DIAGNOSIS — M4712 Other spondylosis with myelopathy, cervical region: Secondary | ICD-10-CM | POA: Diagnosis not present

## 2019-08-25 DIAGNOSIS — M4802 Spinal stenosis, cervical region: Secondary | ICD-10-CM | POA: Diagnosis not present

## 2019-08-25 DIAGNOSIS — Z01812 Encounter for preprocedural laboratory examination: Secondary | ICD-10-CM | POA: Diagnosis not present

## 2019-08-25 DIAGNOSIS — Z20828 Contact with and (suspected) exposure to other viral communicable diseases: Secondary | ICD-10-CM | POA: Diagnosis not present

## 2019-08-31 ENCOUNTER — Ambulatory Visit: Payer: Medicare Other | Admitting: Cardiology

## 2019-09-01 DIAGNOSIS — Z7982 Long term (current) use of aspirin: Secondary | ICD-10-CM | POA: Diagnosis not present

## 2019-09-01 DIAGNOSIS — Z85828 Personal history of other malignant neoplasm of skin: Secondary | ICD-10-CM | POA: Diagnosis not present

## 2019-09-01 DIAGNOSIS — M4712 Other spondylosis with myelopathy, cervical region: Secondary | ICD-10-CM | POA: Diagnosis not present

## 2019-09-01 DIAGNOSIS — M50122 Cervical disc disorder at C5-C6 level with radiculopathy: Secondary | ICD-10-CM | POA: Diagnosis not present

## 2019-09-01 DIAGNOSIS — M50121 Cervical disc disorder at C4-C5 level with radiculopathy: Secondary | ICD-10-CM | POA: Diagnosis not present

## 2019-09-01 DIAGNOSIS — M4322 Fusion of spine, cervical region: Secondary | ICD-10-CM | POA: Diagnosis not present

## 2019-09-01 DIAGNOSIS — M48061 Spinal stenosis, lumbar region without neurogenic claudication: Secondary | ICD-10-CM | POA: Diagnosis present

## 2019-09-01 DIAGNOSIS — M5011 Cervical disc disorder with radiculopathy,  high cervical region: Secondary | ICD-10-CM | POA: Diagnosis not present

## 2019-09-01 DIAGNOSIS — M5021 Other cervical disc displacement,  high cervical region: Secondary | ICD-10-CM | POA: Diagnosis not present

## 2019-09-01 DIAGNOSIS — Z981 Arthrodesis status: Secondary | ICD-10-CM | POA: Diagnosis not present

## 2019-09-01 DIAGNOSIS — M4722 Other spondylosis with radiculopathy, cervical region: Secondary | ICD-10-CM | POA: Diagnosis not present

## 2019-09-01 DIAGNOSIS — M47816 Spondylosis without myelopathy or radiculopathy, lumbar region: Secondary | ICD-10-CM | POA: Diagnosis present

## 2019-09-01 DIAGNOSIS — I10 Essential (primary) hypertension: Secondary | ICD-10-CM | POA: Diagnosis present

## 2019-09-01 DIAGNOSIS — M4802 Spinal stenosis, cervical region: Secondary | ICD-10-CM | POA: Diagnosis not present

## 2019-09-01 DIAGNOSIS — Z87891 Personal history of nicotine dependence: Secondary | ICD-10-CM | POA: Diagnosis not present

## 2019-09-01 DIAGNOSIS — M50123 Cervical disc disorder at C6-C7 level with radiculopathy: Secondary | ICD-10-CM | POA: Diagnosis not present

## 2019-09-02 DIAGNOSIS — M4322 Fusion of spine, cervical region: Secondary | ICD-10-CM | POA: Diagnosis not present

## 2019-09-30 DIAGNOSIS — Z981 Arthrodesis status: Secondary | ICD-10-CM | POA: Diagnosis not present

## 2019-09-30 DIAGNOSIS — M4322 Fusion of spine, cervical region: Secondary | ICD-10-CM | POA: Diagnosis not present

## 2019-10-14 DIAGNOSIS — F900 Attention-deficit hyperactivity disorder, predominantly inattentive type: Secondary | ICD-10-CM | POA: Diagnosis not present

## 2019-10-14 DIAGNOSIS — F320 Major depressive disorder, single episode, mild: Secondary | ICD-10-CM | POA: Diagnosis not present

## 2019-11-15 DIAGNOSIS — F900 Attention-deficit hyperactivity disorder, predominantly inattentive type: Secondary | ICD-10-CM | POA: Diagnosis not present

## 2019-11-15 DIAGNOSIS — F320 Major depressive disorder, single episode, mild: Secondary | ICD-10-CM | POA: Diagnosis not present

## 2019-11-25 DIAGNOSIS — F320 Major depressive disorder, single episode, mild: Secondary | ICD-10-CM | POA: Diagnosis not present

## 2019-11-25 DIAGNOSIS — F900 Attention-deficit hyperactivity disorder, predominantly inattentive type: Secondary | ICD-10-CM | POA: Diagnosis not present

## 2019-11-30 DIAGNOSIS — M4722 Other spondylosis with radiculopathy, cervical region: Secondary | ICD-10-CM | POA: Diagnosis not present

## 2019-11-30 DIAGNOSIS — M4322 Fusion of spine, cervical region: Secondary | ICD-10-CM | POA: Diagnosis not present

## 2019-11-30 DIAGNOSIS — M50123 Cervical disc disorder at C6-C7 level with radiculopathy: Secondary | ICD-10-CM | POA: Diagnosis not present

## 2019-11-30 DIAGNOSIS — M50122 Cervical disc disorder at C5-C6 level with radiculopathy: Secondary | ICD-10-CM | POA: Diagnosis not present

## 2019-12-30 DIAGNOSIS — Z0000 Encounter for general adult medical examination without abnormal findings: Secondary | ICD-10-CM | POA: Diagnosis not present

## 2019-12-30 DIAGNOSIS — E7800 Pure hypercholesterolemia, unspecified: Secondary | ICD-10-CM | POA: Diagnosis not present

## 2019-12-30 DIAGNOSIS — I2510 Atherosclerotic heart disease of native coronary artery without angina pectoris: Secondary | ICD-10-CM | POA: Diagnosis not present

## 2019-12-30 DIAGNOSIS — N400 Enlarged prostate without lower urinary tract symptoms: Secondary | ICD-10-CM | POA: Diagnosis not present

## 2019-12-30 DIAGNOSIS — Z125 Encounter for screening for malignant neoplasm of prostate: Secondary | ICD-10-CM | POA: Diagnosis not present

## 2019-12-30 DIAGNOSIS — F324 Major depressive disorder, single episode, in partial remission: Secondary | ICD-10-CM | POA: Diagnosis not present

## 2019-12-30 DIAGNOSIS — E291 Testicular hypofunction: Secondary | ICD-10-CM | POA: Diagnosis not present

## 2019-12-30 DIAGNOSIS — K219 Gastro-esophageal reflux disease without esophagitis: Secondary | ICD-10-CM | POA: Diagnosis not present

## 2019-12-30 DIAGNOSIS — F900 Attention-deficit hyperactivity disorder, predominantly inattentive type: Secondary | ICD-10-CM | POA: Diagnosis not present

## 2019-12-30 DIAGNOSIS — R7303 Prediabetes: Secondary | ICD-10-CM | POA: Diagnosis not present

## 2019-12-30 DIAGNOSIS — F320 Major depressive disorder, single episode, mild: Secondary | ICD-10-CM | POA: Diagnosis not present

## 2019-12-30 DIAGNOSIS — I10 Essential (primary) hypertension: Secondary | ICD-10-CM | POA: Diagnosis not present

## 2020-01-12 DIAGNOSIS — B353 Tinea pedis: Secondary | ICD-10-CM | POA: Diagnosis not present

## 2020-01-12 DIAGNOSIS — E291 Testicular hypofunction: Secondary | ICD-10-CM | POA: Diagnosis not present

## 2020-01-12 DIAGNOSIS — Z85828 Personal history of other malignant neoplasm of skin: Secondary | ICD-10-CM | POA: Diagnosis not present

## 2020-01-12 DIAGNOSIS — D225 Melanocytic nevi of trunk: Secondary | ICD-10-CM | POA: Diagnosis not present

## 2020-01-12 DIAGNOSIS — L739 Follicular disorder, unspecified: Secondary | ICD-10-CM | POA: Diagnosis not present

## 2020-01-12 DIAGNOSIS — Z125 Encounter for screening for malignant neoplasm of prostate: Secondary | ICD-10-CM | POA: Diagnosis not present

## 2020-01-12 DIAGNOSIS — I10 Essential (primary) hypertension: Secondary | ICD-10-CM | POA: Diagnosis not present

## 2020-01-12 DIAGNOSIS — D485 Neoplasm of uncertain behavior of skin: Secondary | ICD-10-CM | POA: Diagnosis not present

## 2020-01-12 DIAGNOSIS — D2271 Melanocytic nevi of right lower limb, including hip: Secondary | ICD-10-CM | POA: Diagnosis not present

## 2020-01-12 DIAGNOSIS — Q828 Other specified congenital malformations of skin: Secondary | ICD-10-CM | POA: Diagnosis not present

## 2020-01-12 DIAGNOSIS — D2272 Melanocytic nevi of left lower limb, including hip: Secondary | ICD-10-CM | POA: Diagnosis not present

## 2020-01-12 DIAGNOSIS — E7800 Pure hypercholesterolemia, unspecified: Secondary | ICD-10-CM | POA: Diagnosis not present

## 2020-01-12 DIAGNOSIS — L821 Other seborrheic keratosis: Secondary | ICD-10-CM | POA: Diagnosis not present

## 2020-01-12 DIAGNOSIS — L570 Actinic keratosis: Secondary | ICD-10-CM | POA: Diagnosis not present

## 2020-01-12 DIAGNOSIS — R7303 Prediabetes: Secondary | ICD-10-CM | POA: Diagnosis not present

## 2020-03-17 DIAGNOSIS — L859 Epidermal thickening, unspecified: Secondary | ICD-10-CM | POA: Diagnosis not present

## 2020-03-17 DIAGNOSIS — C44219 Basal cell carcinoma of skin of left ear and external auricular canal: Secondary | ICD-10-CM | POA: Diagnosis not present

## 2020-03-17 DIAGNOSIS — Z85828 Personal history of other malignant neoplasm of skin: Secondary | ICD-10-CM | POA: Diagnosis not present

## 2020-03-17 DIAGNOSIS — D485 Neoplasm of uncertain behavior of skin: Secondary | ICD-10-CM | POA: Diagnosis not present

## 2020-03-17 DIAGNOSIS — L570 Actinic keratosis: Secondary | ICD-10-CM | POA: Diagnosis not present

## 2020-03-31 DIAGNOSIS — L570 Actinic keratosis: Secondary | ICD-10-CM | POA: Diagnosis not present

## 2020-04-21 DIAGNOSIS — L570 Actinic keratosis: Secondary | ICD-10-CM | POA: Diagnosis not present

## 2020-04-28 DIAGNOSIS — L570 Actinic keratosis: Secondary | ICD-10-CM | POA: Diagnosis not present

## 2020-05-30 DIAGNOSIS — D2239 Melanocytic nevi of other parts of face: Secondary | ICD-10-CM | POA: Diagnosis not present

## 2020-05-30 DIAGNOSIS — Z85828 Personal history of other malignant neoplasm of skin: Secondary | ICD-10-CM | POA: Diagnosis not present

## 2020-05-30 DIAGNOSIS — D485 Neoplasm of uncertain behavior of skin: Secondary | ICD-10-CM | POA: Diagnosis not present

## 2020-05-30 DIAGNOSIS — L570 Actinic keratosis: Secondary | ICD-10-CM | POA: Diagnosis not present

## 2020-06-08 DIAGNOSIS — M7989 Other specified soft tissue disorders: Secondary | ICD-10-CM | POA: Diagnosis not present

## 2020-06-08 DIAGNOSIS — R2232 Localized swelling, mass and lump, left upper limb: Secondary | ICD-10-CM | POA: Diagnosis not present

## 2020-07-19 DIAGNOSIS — D2339 Other benign neoplasm of skin of other parts of face: Secondary | ICD-10-CM | POA: Diagnosis not present

## 2020-07-19 DIAGNOSIS — Z85828 Personal history of other malignant neoplasm of skin: Secondary | ICD-10-CM | POA: Diagnosis not present

## 2020-07-19 DIAGNOSIS — L570 Actinic keratosis: Secondary | ICD-10-CM | POA: Diagnosis not present

## 2020-07-19 DIAGNOSIS — D485 Neoplasm of uncertain behavior of skin: Secondary | ICD-10-CM | POA: Diagnosis not present

## 2020-07-19 DIAGNOSIS — D0461 Carcinoma in situ of skin of right upper limb, including shoulder: Secondary | ICD-10-CM | POA: Diagnosis not present

## 2020-07-19 DIAGNOSIS — L738 Other specified follicular disorders: Secondary | ICD-10-CM | POA: Diagnosis not present

## 2020-08-31 DIAGNOSIS — E7800 Pure hypercholesterolemia, unspecified: Secondary | ICD-10-CM | POA: Diagnosis not present

## 2020-08-31 DIAGNOSIS — R7309 Other abnormal glucose: Secondary | ICD-10-CM | POA: Diagnosis not present

## 2020-08-31 DIAGNOSIS — I2510 Atherosclerotic heart disease of native coronary artery without angina pectoris: Secondary | ICD-10-CM | POA: Diagnosis not present

## 2020-08-31 DIAGNOSIS — R7303 Prediabetes: Secondary | ICD-10-CM | POA: Diagnosis not present

## 2020-08-31 DIAGNOSIS — K219 Gastro-esophageal reflux disease without esophagitis: Secondary | ICD-10-CM | POA: Diagnosis not present

## 2020-08-31 DIAGNOSIS — I10 Essential (primary) hypertension: Secondary | ICD-10-CM | POA: Diagnosis not present

## 2020-08-31 DIAGNOSIS — N400 Enlarged prostate without lower urinary tract symptoms: Secondary | ICD-10-CM | POA: Diagnosis not present

## 2020-08-31 DIAGNOSIS — F324 Major depressive disorder, single episode, in partial remission: Secondary | ICD-10-CM | POA: Diagnosis not present

## 2020-09-11 DIAGNOSIS — F900 Attention-deficit hyperactivity disorder, predominantly inattentive type: Secondary | ICD-10-CM | POA: Diagnosis not present

## 2020-09-14 DIAGNOSIS — Z85828 Personal history of other malignant neoplasm of skin: Secondary | ICD-10-CM | POA: Diagnosis not present

## 2020-09-14 DIAGNOSIS — D485 Neoplasm of uncertain behavior of skin: Secondary | ICD-10-CM | POA: Diagnosis not present

## 2020-09-14 DIAGNOSIS — C44629 Squamous cell carcinoma of skin of left upper limb, including shoulder: Secondary | ICD-10-CM | POA: Diagnosis not present

## 2020-11-13 DIAGNOSIS — Z23 Encounter for immunization: Secondary | ICD-10-CM | POA: Diagnosis not present

## 2020-11-30 DIAGNOSIS — H5201 Hypermetropia, right eye: Secondary | ICD-10-CM | POA: Diagnosis not present

## 2020-11-30 DIAGNOSIS — H25813 Combined forms of age-related cataract, bilateral: Secondary | ICD-10-CM | POA: Diagnosis not present

## 2020-11-30 DIAGNOSIS — H524 Presbyopia: Secondary | ICD-10-CM | POA: Diagnosis not present

## 2020-12-26 DIAGNOSIS — H268 Other specified cataract: Secondary | ICD-10-CM | POA: Diagnosis not present

## 2020-12-26 DIAGNOSIS — H21562 Pupillary abnormality, left eye: Secondary | ICD-10-CM | POA: Diagnosis not present

## 2020-12-28 DIAGNOSIS — F320 Major depressive disorder, single episode, mild: Secondary | ICD-10-CM | POA: Diagnosis not present

## 2020-12-28 DIAGNOSIS — F900 Attention-deficit hyperactivity disorder, predominantly inattentive type: Secondary | ICD-10-CM | POA: Diagnosis not present

## 2021-01-02 DIAGNOSIS — H25811 Combined forms of age-related cataract, right eye: Secondary | ICD-10-CM | POA: Diagnosis not present

## 2021-01-02 DIAGNOSIS — H21561 Pupillary abnormality, right eye: Secondary | ICD-10-CM | POA: Diagnosis not present

## 2021-01-02 DIAGNOSIS — H268 Other specified cataract: Secondary | ICD-10-CM | POA: Diagnosis not present

## 2021-01-10 DIAGNOSIS — L570 Actinic keratosis: Secondary | ICD-10-CM | POA: Diagnosis not present

## 2021-01-10 DIAGNOSIS — D485 Neoplasm of uncertain behavior of skin: Secondary | ICD-10-CM | POA: Diagnosis not present

## 2021-01-10 DIAGNOSIS — E7800 Pure hypercholesterolemia, unspecified: Secondary | ICD-10-CM | POA: Diagnosis not present

## 2021-01-10 DIAGNOSIS — I10 Essential (primary) hypertension: Secondary | ICD-10-CM | POA: Diagnosis not present

## 2021-01-10 DIAGNOSIS — Z85828 Personal history of other malignant neoplasm of skin: Secondary | ICD-10-CM | POA: Diagnosis not present

## 2021-01-10 DIAGNOSIS — C44629 Squamous cell carcinoma of skin of left upper limb, including shoulder: Secondary | ICD-10-CM | POA: Diagnosis not present

## 2021-01-10 DIAGNOSIS — D0421 Carcinoma in situ of skin of right ear and external auricular canal: Secondary | ICD-10-CM | POA: Diagnosis not present

## 2021-01-10 DIAGNOSIS — F324 Major depressive disorder, single episode, in partial remission: Secondary | ICD-10-CM | POA: Diagnosis not present

## 2021-01-10 DIAGNOSIS — N400 Enlarged prostate without lower urinary tract symptoms: Secondary | ICD-10-CM | POA: Diagnosis not present

## 2021-01-10 DIAGNOSIS — I2510 Atherosclerotic heart disease of native coronary artery without angina pectoris: Secondary | ICD-10-CM | POA: Diagnosis not present

## 2021-01-10 DIAGNOSIS — Z0000 Encounter for general adult medical examination without abnormal findings: Secondary | ICD-10-CM | POA: Diagnosis not present

## 2021-01-10 DIAGNOSIS — K219 Gastro-esophageal reflux disease without esophagitis: Secondary | ICD-10-CM | POA: Diagnosis not present

## 2021-01-10 DIAGNOSIS — B351 Tinea unguium: Secondary | ICD-10-CM | POA: Diagnosis not present

## 2021-01-10 DIAGNOSIS — R7303 Prediabetes: Secondary | ICD-10-CM | POA: Diagnosis not present

## 2021-01-17 ENCOUNTER — Other Ambulatory Visit: Payer: Self-pay

## 2021-01-17 ENCOUNTER — Ambulatory Visit (INDEPENDENT_AMBULATORY_CARE_PROVIDER_SITE_OTHER): Payer: Medicare Other | Admitting: Podiatry

## 2021-01-17 ENCOUNTER — Encounter: Payer: Self-pay | Admitting: Podiatry

## 2021-01-17 DIAGNOSIS — B351 Tinea unguium: Secondary | ICD-10-CM | POA: Diagnosis not present

## 2021-01-17 NOTE — Progress Notes (Signed)
Subjective:  Patient ID: Kenneth Henderson, male    DOB: October 12, 1951,  MRN: [id number]  Chief Complaint  Patient presents with  . Nail Problem    Left foot 1st and 2nd toe has nail fungus.    70 y.o. male presents with the above complaint. Patient presents with complaint of left 1st and 2nd digit nail fungus. Patient states it is mildly painful but overall it has been present for quite some time. He states that he wants to get it treated. He does not want to do medication orally. He is tried over-the-counter application topically which has helped some but it keeps coming back. He would like to discuss all the available treatment options for nail fungus. He has not seen anyone else prior to seeing me for this thing. He denies any other acute complaints.   Review of Systems: Negative except as noted in the HPI. Denies N/V/F/Ch.  Past Medical History:  Diagnosis Date  . Actinic keratosis treated with topical fluorouracil (5FU)   . Anxiety   . Arthritis    "mostly in my shoulders; spine" (04/03/2017)  . Attention deficit disorder (ADD) in adult   . Basal cell carcinoma   . Coronary artery disease     CAD with cath 06/2014 in setting of inferior MI showing occluded RCA and underwent PCI with DES to the RCA.  EF at that time was 40-45% and echo 9/15 showed EF 60-65% with normal RV in [place]  . Depression   . GERD (gastroesophageal reflux disease)   . Hyperlipidemia LDL goal <70   . Hypertension   . Inferior MI ([place]) 06/2014   Archie Endo 04/03/2017  . Melanoma of forearm, left ([place])   . Sleep apnea    "never took test or wore mask" (04/03/2017)  . Squamous carcinoma     Current Outpatient Medications:  .  irbesartan (AVAPRO) 150 MG tablet, 1 tablet, Disp: , Rfl:  .  aspirin 81 MG tablet, Take 81 mg by mouth daily., Disp: , Rfl:  .  atorvastatin (LIPITOR) 80 MG tablet, Take 80 mg by mouth daily., Disp: , Rfl:  .  buPROPion (WELLBUTRIN XL) 150 MG 24 hr tablet, Take 300 mg by mouth daily., Disp: ,  Rfl:  .  buPROPion (WELLBUTRIN XL) 300 MG 24 hr tablet, Take 300 mg by mouth daily. , Disp: , Rfl:  .  chlorhexidine (PERIDEX) 0.12 % solution, RINSE MOUTH WITH 15ML (1 CAPFUL) FOR 30 SECONDS am AND PM AFTER TOOTHBRUSHING. EXPECTORATE AFTER RINSING, DO NOT SWALLOW, Disp: , Rfl: 1 .  citalopram (CELEXA) 20 MG tablet, Take 1 tablet (20 mg total) by mouth daily., Disp: 30 tablet, Rfl: 0 .  esomeprazole (NEXIUM) 40 MG capsule, Take 40 mg by mouth daily at 12 noon., Disp: , Rfl:  .  fluorouracil (EFUDEX) 5 % cream, APPLY TO AFFECTED AREA OF SKIN 2 TIMES DAILY AS DIRECTED FOR 2 WEEKS, Disp: , Rfl: 0 .  Glucosamine-Chondroitin 1500-1200 MG/30ML LIQD, Take by mouth., Disp: , Rfl:  .  GLUCOSAMINE-CHONDROITIN PO, Take 1 tablet by mouth daily., Disp: , Rfl:  .  irbesartan (AVAPRO) 150 MG tablet, Take 150 mg by mouth daily., Disp: , Rfl:  .  ketoconazole (NIZORAL) 2 % cream, APPLY TO AFFECTED AREA 2 TIMES DAILY FOR 2 WEEKS, Disp: , Rfl: 1 .  lisinopril (ZESTRIL) 10 MG tablet, , Disp: , Rfl:  .  methylphenidate (METADATE CD) 40 MG CR capsule, Take 40 mg by mouth every morning., Disp: , Rfl: 0 .  metoprolol succinate (TOPROL-XL) 50 MG 24 hr tablet, Take 50 mg by mouth daily. Take with or immediately following a meal., Disp: , Rfl:  .  moxifloxacin (VIGAMOX) 0.5 % ophthalmic solution, Apply to eye., Disp: , Rfl:  .  Naftifine HCl (NAFTIN) 2 % CREA, Apply 1 application topically 1 day or 1 dose., Disp: 60 g, Rfl: 2 .  NITROSTAT 0.4 MG SL tablet, Place 0.4 mg under the tongue every 5 (five) minutes as needed for chest pain. , Disp: , Rfl:  .  Omega-3 Fatty Acids (FISH OIL) 1000 MG CAPS, Take 1,000 mg by mouth daily., Disp: , Rfl:  .  prednisoLONE acetate (PRED FORTE) 1 % ophthalmic suspension, SMARTSIG:In Eye(s), Disp: , Rfl:  .  PREVIDENT 5000 SENSITIVE 1.1-5 % PSTE, BRUSH ON teeth 2 TIMES DAILY FOR 2 minutes. spit out as much possible. DO not SWISH, eat, OR drink FOR 30 minutes, Disp: , Rfl: 99 .  PROLENSA 0.07  % SOLN, Apply to eye., Disp: , Rfl:   Social History   Tobacco Use  Smoking Status Former Smoker  . Packs/day: 1.00  . Years: 15.00  . Pack years: 15.00  . Types: Cigarettes  Smokeless Tobacco Never Used  Tobacco Comment   04/03/2017 "quit smoking in the 1980s; chewed some in high school"    No Known Allergies Objective:  There were no vitals filed for this visit. There is no height or weight on file to calculate BMI. Constitutional Well developed. Well nourished.  Vascular Dorsalis pedis pulses palpable bilaterally. Posterior tibial pulses palpable bilaterally. Capillary refill normal to all digits.  No cyanosis or clubbing noted. Pedal hair growth normal.  Neurologic Normal speech. Oriented to person, place, and time. Epicritic sensation to light touch grossly present bilaterally.  Dermatologic  thickened elongated dystrophic toenails x2 to the left hallux and 2nd digit. Mild pain on palpation  Orthopedic: Normal joint ROM without pain or crepitus bilaterally. No visible deformities. No bony tenderness.   Radiographs: None Assessment:   1. Onychomycosis due to dermatophyte   2. Nail fungus    Plan:  Patient was evaluated and treated and all questions answered.  Left hallux and 2nd digit onychomycosis -Educated the patient on the etiology of onychomycosis and various treatment options associated with improving the fungal load.  I explained to the patient that there is 3 treatment options available to treat the onychomycosis including topical, p.o., laser treatment. Patient has elected to go laser therapy. I discussed with the patient the benefits of laser therapy for nail fungus. Patient agrees with Like to proceed with the laser therapy. Will be scheduled see Caryl Pina for laser.   No follow-ups on file.

## 2021-01-25 DIAGNOSIS — H903 Sensorineural hearing loss, bilateral: Secondary | ICD-10-CM | POA: Diagnosis not present

## 2021-01-25 DIAGNOSIS — H9313 Tinnitus, bilateral: Secondary | ICD-10-CM | POA: Diagnosis not present

## 2021-01-31 DIAGNOSIS — Z85828 Personal history of other malignant neoplasm of skin: Secondary | ICD-10-CM | POA: Diagnosis not present

## 2021-01-31 DIAGNOSIS — D0439 Carcinoma in situ of skin of other parts of face: Secondary | ICD-10-CM | POA: Diagnosis not present

## 2021-02-02 ENCOUNTER — Other Ambulatory Visit: Payer: BLUE CROSS/BLUE SHIELD

## 2021-02-12 ENCOUNTER — Ambulatory Visit (INDEPENDENT_AMBULATORY_CARE_PROVIDER_SITE_OTHER): Payer: Medicare Other

## 2021-02-12 ENCOUNTER — Other Ambulatory Visit: Payer: Self-pay

## 2021-02-12 DIAGNOSIS — B351 Tinea unguium: Secondary | ICD-10-CM

## 2021-02-12 NOTE — Progress Notes (Signed)
Patient presents today for the 1st laser treatment. Diagnosed with mycotic nail infection by Dr.Patel.   Toenail most affected left Hallux and 2nd.  All other systems are negative.  Nails were filed thin. Laser therapy was administered to Hallux and 2nd toenails and patient tolerated the treatment well. All safety precautions were in place.    Follow up in 4 weeks for laser # 2.   Please take Picture of nails at next visit to document visual progress.

## 2021-02-15 DIAGNOSIS — L245 Irritant contact dermatitis due to other chemical products: Secondary | ICD-10-CM | POA: Diagnosis not present

## 2021-03-12 ENCOUNTER — Other Ambulatory Visit: Payer: Medicare Other

## 2021-03-16 ENCOUNTER — Ambulatory Visit (INDEPENDENT_AMBULATORY_CARE_PROVIDER_SITE_OTHER): Payer: Medicare Other | Admitting: *Deleted

## 2021-03-16 ENCOUNTER — Other Ambulatory Visit: Payer: Self-pay

## 2021-03-16 DIAGNOSIS — B351 Tinea unguium: Secondary | ICD-10-CM

## 2021-03-16 NOTE — Progress Notes (Signed)
Patient presents today for the 2nd laser treatment. Diagnosed with mycotic nail infection by Dr.Patel.   Toenail most affected is the hallux and 2nd nail left.   All other systems are negative.  Nails were filed thin. Laser therapy was administered to Hallux and 2nd toenails and patient tolerated the treatment well. All safety precautions were in place.    Follow up in 4 weeks for laser # 3.  Picture of nails taken today to document visual progress

## 2021-03-28 DIAGNOSIS — Z85828 Personal history of other malignant neoplasm of skin: Secondary | ICD-10-CM | POA: Diagnosis not present

## 2021-03-28 DIAGNOSIS — L570 Actinic keratosis: Secondary | ICD-10-CM | POA: Diagnosis not present

## 2021-04-04 DIAGNOSIS — C4441 Basal cell carcinoma of skin of scalp and neck: Secondary | ICD-10-CM | POA: Diagnosis not present

## 2021-04-04 DIAGNOSIS — L738 Other specified follicular disorders: Secondary | ICD-10-CM | POA: Diagnosis not present

## 2021-04-04 DIAGNOSIS — D1801 Hemangioma of skin and subcutaneous tissue: Secondary | ICD-10-CM | POA: Diagnosis not present

## 2021-04-04 DIAGNOSIS — L814 Other melanin hyperpigmentation: Secondary | ICD-10-CM | POA: Diagnosis not present

## 2021-04-04 DIAGNOSIS — D225 Melanocytic nevi of trunk: Secondary | ICD-10-CM | POA: Diagnosis not present

## 2021-04-04 DIAGNOSIS — L570 Actinic keratosis: Secondary | ICD-10-CM | POA: Diagnosis not present

## 2021-04-04 DIAGNOSIS — D485 Neoplasm of uncertain behavior of skin: Secondary | ICD-10-CM | POA: Diagnosis not present

## 2021-04-04 DIAGNOSIS — L821 Other seborrheic keratosis: Secondary | ICD-10-CM | POA: Diagnosis not present

## 2021-04-04 DIAGNOSIS — Z85828 Personal history of other malignant neoplasm of skin: Secondary | ICD-10-CM | POA: Diagnosis not present

## 2021-04-13 ENCOUNTER — Other Ambulatory Visit: Payer: BLUE CROSS/BLUE SHIELD

## 2021-05-11 ENCOUNTER — Ambulatory Visit (INDEPENDENT_AMBULATORY_CARE_PROVIDER_SITE_OTHER): Payer: Medicare Other | Admitting: *Deleted

## 2021-05-11 ENCOUNTER — Other Ambulatory Visit: Payer: Self-pay

## 2021-05-11 DIAGNOSIS — B351 Tinea unguium: Secondary | ICD-10-CM

## 2021-05-11 NOTE — Progress Notes (Signed)
Patient presents today for the 3rd laser treatment. Diagnosed with mycotic nail infection by Dr.Patel.   Toenail most affected is the hallux and 2nd nail left. There hasn't been a whole lot of change.  All other systems are negative.  Nails were filed thin. Laser therapy was administered to Hallux and 2nd toenails and patient tolerated the treatment well. All safety precautions were in place.   Patient would like to discontinue laser therapy at this time. He is interested in coming in for routine foot care every few months. The hallux left, lateral border, is ingrown and that is where most of the discoloration is as well. I advised he could get a procedure to removed the ingrown portion of nail and also take care of a lot of the fungal area too. He will think about this and let us know.  Follow up in 2 months to see either Dr. Prudence Davidson or [facility] for nail care.

## 2021-07-04 ENCOUNTER — Other Ambulatory Visit: Payer: Self-pay

## 2021-07-04 ENCOUNTER — Ambulatory Visit (INDEPENDENT_AMBULATORY_CARE_PROVIDER_SITE_OTHER): Payer: Medicare Other | Admitting: Podiatry

## 2021-07-04 DIAGNOSIS — B351 Tinea unguium: Secondary | ICD-10-CM | POA: Diagnosis not present

## 2021-07-10 ENCOUNTER — Encounter: Payer: Self-pay | Admitting: Podiatry

## 2021-07-10 DIAGNOSIS — I10 Essential (primary) hypertension: Secondary | ICD-10-CM | POA: Diagnosis not present

## 2021-07-10 DIAGNOSIS — F324 Major depressive disorder, single episode, in partial remission: Secondary | ICD-10-CM | POA: Diagnosis not present

## 2021-07-10 DIAGNOSIS — E7800 Pure hypercholesterolemia, unspecified: Secondary | ICD-10-CM | POA: Diagnosis not present

## 2021-07-10 DIAGNOSIS — I2510 Atherosclerotic heart disease of native coronary artery without angina pectoris: Secondary | ICD-10-CM | POA: Diagnosis not present

## 2021-07-10 DIAGNOSIS — R7309 Other abnormal glucose: Secondary | ICD-10-CM | POA: Diagnosis not present

## 2021-07-10 DIAGNOSIS — K219 Gastro-esophageal reflux disease without esophagitis: Secondary | ICD-10-CM | POA: Diagnosis not present

## 2021-07-10 NOTE — Progress Notes (Signed)
Subjective:  Patient ID: Kenneth Henderson, male    DOB: [birth year],  MRN: [id number]  Chief Complaint  Patient presents with   Nail Problem    Nail trim     70 y.o. male presents with the above complaint.  Patient presents with follow-up to left first and second digit onychomycosis.  He states is doing a lot better the laser treatment helped considerably.  He states that his nails seem to have improved considerably.  He denies any other acute complaints.  He would like to discuss further options if any.   Review of Systems: Negative except as noted in the HPI. Denies N/V/F/Ch.  Past Medical History:  Diagnosis Date   Actinic keratosis treated with topical fluorouracil (5FU)    Anxiety    Arthritis    "mostly in my shoulders; spine" (04/03/2017)   Attention deficit disorder (ADD) in adult    Basal cell carcinoma    Coronary artery disease     CAD with cath 06/2014 in setting of inferior MI showing occluded RCA and underwent PCI with DES to the RCA.  EF at that time was 40-45% and echo 9/15 showed EF 60-65% with normal RV in [facility]   Depression    GERD (gastroesophageal reflux disease)    Hyperlipidemia LDL goal <70    Hypertension    Inferior MI ([place]) 06/2014   Archie Endo 04/03/2017   Melanoma of forearm, left ([place])    Sleep apnea    "never took test or wore mask" (04/03/2017)   Squamous carcinoma     Current Outpatient Medications:    aspirin 81 MG tablet, Take 81 mg by mouth daily., Disp: , Rfl:    atorvastatin (LIPITOR) 80 MG tablet, Take 80 mg by mouth daily., Disp: , Rfl:    buPROPion (WELLBUTRIN XL) 150 MG 24 hr tablet, Take 300 mg by mouth daily., Disp: , Rfl:    buPROPion (WELLBUTRIN XL) 300 MG 24 hr tablet, Take 300 mg by mouth daily. , Disp: , Rfl:    chlorhexidine (PERIDEX) 0.12 % solution, RINSE MOUTH WITH 15ML (1 CAPFUL) FOR 30 SECONDS am AND PM AFTER TOOTHBRUSHING. EXPECTORATE AFTER RINSING, DO NOT SWALLOW, Disp: , Rfl: 1   citalopram (CELEXA) 20 MG tablet, Take 1 tablet  (20 mg total) by mouth daily., Disp: 30 tablet, Rfl: 0   esomeprazole (NEXIUM) 40 MG capsule, Take 40 mg by mouth daily at 12 noon., Disp: , Rfl:    fluorouracil (EFUDEX) 5 % cream, APPLY TO AFFECTED AREA OF SKIN 2 TIMES DAILY AS DIRECTED FOR 2 WEEKS, Disp: , Rfl: 0   Glucosamine-Chondroitin 1500-1200 MG/30ML LIQD, Take by mouth., Disp: , Rfl:    GLUCOSAMINE-CHONDROITIN PO, Take 1 tablet by mouth daily., Disp: , Rfl:    irbesartan (AVAPRO) 150 MG tablet, Take 150 mg by mouth daily., Disp: , Rfl:    irbesartan (AVAPRO) 150 MG tablet, 1 tablet, Disp: , Rfl:    ketoconazole (NIZORAL) 2 % cream, APPLY TO AFFECTED AREA 2 TIMES DAILY FOR 2 WEEKS, Disp: , Rfl: 1   lisinopril (ZESTRIL) 10 MG tablet, , Disp: , Rfl:    methylphenidate (METADATE CD) 40 MG CR capsule, Take 40 mg by mouth every morning., Disp: , Rfl: 0   metoprolol succinate (TOPROL-XL) 50 MG 24 hr tablet, Take 50 mg by mouth daily. Take with or immediately following a meal., Disp: , Rfl:    moxifloxacin (VIGAMOX) 0.5 % ophthalmic solution, Apply to eye., Disp: , Rfl:    Naftifine HCl (NAFTIN)  2 % CREA, Apply 1 application topically 1 day or 1 dose., Disp: 60 g, Rfl: 2   NITROSTAT 0.4 MG SL tablet, Place 0.4 mg under the tongue every 5 (five) minutes as needed for chest pain. , Disp: , Rfl:    Omega-3 Fatty Acids (FISH OIL) 1000 MG CAPS, Take 1,000 mg by mouth daily., Disp: , Rfl:    prednisoLONE acetate (PRED FORTE) 1 % ophthalmic suspension, SMARTSIG:In Eye(s), Disp: , Rfl:    PREVIDENT 5000 SENSITIVE 1.1-5 % PSTE, BRUSH ON teeth 2 TIMES DAILY FOR 2 minutes. spit out as much possible. DO not SWISH, eat, OR drink FOR 30 minutes, Disp: , Rfl: 99   PROLENSA 0.07 % SOLN, Apply to eye., Disp: , Rfl:   Social History   Tobacco Use  Smoking Status Former   Packs/day: 1.00   Years: 15.00   Pack years: 15.00   Types: Cigarettes  Smokeless Tobacco Never  Tobacco Comments   04/03/2017 "quit smoking in the 1980s; chewed some in high school"     No Known Allergies Objective:  There were no vitals filed for this visit. There is no height or weight on file to calculate BMI. Constitutional Well developed. Well nourished.  Vascular Dorsalis pedis pulses palpable bilaterally. Posterior tibial pulses palpable bilaterally. Capillary refill normal to all digits.  No cyanosis or clubbing noted. Pedal hair growth normal.  Neurologic Normal speech. Oriented to person, place, and time. Epicritic sensation to light touch grossly present bilaterally.  Dermatologic  thickened elongated dystrophic toenails x2 to the left hallux and 2nd digit which has improved and the discoloration has regressed  Orthopedic: Normal joint ROM without pain or crepitus bilaterally. No visible deformities. No bony tenderness.   Radiographs: None Assessment:   1. Onychomycosis due to dermatophyte   2. Nail fungus     Plan:  Patient was evaluated and treated and all questions answered.  Left hallux and 2nd digit onychomycosis -Clinically patient has had significant regular progression of onychomycosis.  At this time I discussed prevention as well as shoe gear modification for further prevention.  I discussed with him that if it recurs we can immediately start laser treatment.  He states understanding.  I will hold off on any further laser treatment for now.   No follow-ups on file.

## 2021-10-05 ENCOUNTER — Other Ambulatory Visit: Payer: Self-pay

## 2021-10-05 ENCOUNTER — Ambulatory Visit (INDEPENDENT_AMBULATORY_CARE_PROVIDER_SITE_OTHER): Payer: Medicare Other | Admitting: Podiatry

## 2021-10-05 DIAGNOSIS — B351 Tinea unguium: Secondary | ICD-10-CM | POA: Diagnosis not present

## 2021-10-05 DIAGNOSIS — M79675 Pain in left toe(s): Secondary | ICD-10-CM

## 2021-10-05 DIAGNOSIS — M79674 Pain in right toe(s): Secondary | ICD-10-CM | POA: Diagnosis not present

## 2021-10-10 ENCOUNTER — Encounter: Payer: Self-pay | Admitting: Podiatry

## 2021-10-10 NOTE — Progress Notes (Signed)
  Subjective:  Patient ID: Kenneth Henderson, male    DOB: 02/10/1951,  MRN: [id number]  Chief Complaint  Patient presents with   Nail Problem    NAIL TRIM    70 y.o. male returns for the above complaint.  Patient presents with thickened elongated dystrophic toenails x10.  Pain on palpation.  Patient would like for me to debride down.  He is doing well with his laser therapy.  He is going to be holding it off for now.  He denies any other acute complaints.  Objective:  There were no vitals filed for this visit. Podiatric Exam: Vascular: dorsalis pedis and posterior tibial pulses are palpable bilateral. Capillary return is immediate. Temperature gradient is WNL. Skin turgor WNL  Sensorium: Normal Semmes Weinstein monofilament test. Normal tactile sensation bilaterally. Nail Exam: Pt has thick disfigured discolored nails with subungual debris noted bilateral entire nail hallux through fifth toenails.  Pain on palpation to the nails. Ulcer Exam: There is no evidence of ulcer or pre-ulcerative changes or infection. Orthopedic Exam: Muscle tone and strength are WNL. No limitations in general ROM. No crepitus or effusions noted.  Skin: No Porokeratosis. No infection or ulcers    Assessment & Plan:   1. Pain due to onychomycosis of toenails of both feet     Patient was evaluated and treated and all questions answered.  Onychomycosis with pain  -Nails palliatively debrided as below. -Educated on self-care  Procedure: Nail Debridement Rationale: pain  Type of Debridement: manual, sharp debridement. Instrumentation: Nail nipper, rotary burr. Number of Nails: 10  Procedures and Treatment: Consent by patient was obtained for treatment procedures. The patient understood the discussion of treatment and procedures well. All questions were answered thoroughly reviewed. Debridement of mycotic and hypertrophic toenails, 1 through 5 bilateral and clearing of subungual debris. No ulceration, no  infection noted.  Return Visit-Office Procedure: Patient instructed to return to the office for a follow up visit 3 months for continued evaluation and treatment.  Boneta Lucks, DPM    No follow-ups on file.

## 2021-10-18 DIAGNOSIS — Z23 Encounter for immunization: Secondary | ICD-10-CM | POA: Diagnosis not present

## 2021-10-31 DIAGNOSIS — L570 Actinic keratosis: Secondary | ICD-10-CM | POA: Diagnosis not present

## 2021-10-31 DIAGNOSIS — L821 Other seborrheic keratosis: Secondary | ICD-10-CM | POA: Diagnosis not present

## 2021-10-31 DIAGNOSIS — Z85828 Personal history of other malignant neoplasm of skin: Secondary | ICD-10-CM | POA: Diagnosis not present

## 2021-10-31 DIAGNOSIS — D485 Neoplasm of uncertain behavior of skin: Secondary | ICD-10-CM | POA: Diagnosis not present

## 2021-10-31 DIAGNOSIS — D225 Melanocytic nevi of trunk: Secondary | ICD-10-CM | POA: Diagnosis not present

## 2021-11-27 DIAGNOSIS — Z961 Presence of intraocular lens: Secondary | ICD-10-CM | POA: Diagnosis not present

## 2021-11-27 DIAGNOSIS — H5201 Hypermetropia, right eye: Secondary | ICD-10-CM | POA: Diagnosis not present

## 2021-11-27 DIAGNOSIS — H2513 Age-related nuclear cataract, bilateral: Secondary | ICD-10-CM | POA: Diagnosis not present

## 2022-01-11 ENCOUNTER — Ambulatory Visit: Payer: BLUE CROSS/BLUE SHIELD | Admitting: Podiatry

## 2022-01-14 ENCOUNTER — Emergency Department (HOSPITAL_BASED_OUTPATIENT_CLINIC_OR_DEPARTMENT_OTHER): Payer: Medicare Other

## 2022-01-14 ENCOUNTER — Encounter (HOSPITAL_BASED_OUTPATIENT_CLINIC_OR_DEPARTMENT_OTHER): Payer: Self-pay | Admitting: Emergency Medicine

## 2022-01-14 ENCOUNTER — Other Ambulatory Visit: Payer: Self-pay

## 2022-01-14 ENCOUNTER — Emergency Department (HOSPITAL_BASED_OUTPATIENT_CLINIC_OR_DEPARTMENT_OTHER)
Admission: EM | Admit: 2022-01-14 | Discharge: 2022-01-14 | Disposition: A | Discharge status: Home / Self Care | Payer: Medicare Other | Attending: Emergency Medicine | Admitting: Emergency Medicine

## 2022-01-14 DIAGNOSIS — R051 Acute cough: Secondary | ICD-10-CM | POA: Insufficient documentation

## 2022-01-14 DIAGNOSIS — Z79899 Other long term (current) drug therapy: Secondary | ICD-10-CM | POA: Diagnosis not present

## 2022-01-14 DIAGNOSIS — I10 Essential (primary) hypertension: Secondary | ICD-10-CM | POA: Insufficient documentation

## 2022-01-14 DIAGNOSIS — R059 Cough, unspecified: Secondary | ICD-10-CM | POA: Diagnosis not present

## 2022-01-14 DIAGNOSIS — Z7982 Long term (current) use of aspirin: Secondary | ICD-10-CM | POA: Insufficient documentation

## 2022-01-14 DIAGNOSIS — I2510 Atherosclerotic heart disease of native coronary artery without angina pectoris: Secondary | ICD-10-CM | POA: Diagnosis not present

## 2022-01-14 DIAGNOSIS — Z20822 Contact with and (suspected) exposure to covid-19: Secondary | ICD-10-CM | POA: Insufficient documentation

## 2022-01-14 LAB — RESP PANEL BY RT-PCR (FLU A&B, COVID) ARPGX2
Influenza A by PCR: NEGATIVE
Influenza B by PCR: NEGATIVE
SARS Coronavirus 2 by RT PCR: NEGATIVE

## 2022-01-14 MED ORDER — BENZONATATE 100 MG PO CAPS: 100.0000 mg | ORAL_CAPSULE | Freq: Three times a day (TID) | ORAL | 0 refills | Status: AC

## 2022-01-14 MED ORDER — BENZONATATE 100 MG PO CAPS
100.0000 mg | ORAL_CAPSULE | Freq: Once | ORAL | Status: AC
  Administered 2022-01-14: 100 mg via ORAL
  Filled 2022-01-14: qty 1

## 2022-01-14 MED ORDER — PREDNISONE 20 MG PO TABS: 20.0000 mg | ORAL_TABLET | Freq: Every day | ORAL | 0 refills | Status: AC

## 2022-01-14 NOTE — ED Provider Notes (Signed)
[facility] EMERGENCY DEPT Provider Note   CSN: [id number] Arrival date & time: 01/14/22  1203     History  Chief Complaint  Patient presents with   Nasal Congestion   Cough    Kenneth Henderson is a 71 y.o. male with a past medical history significant for CAD, hypertension, and hyperlipidemia who presents to the ED due to productive cough x1 month.  Patient admits to a productive cough with yellow phlegm.  No history of asthma or COPD.  Denies chest pain or shortness of breath.  Denies sore throat.  No rhinorrhea.  No sick contacts or known COVID exposures.  He has been taking DayQuil and NyQuil with no relief.  No fever or chills.       Home Medications Prior to Admission medications   Medication Sig Start Date End Date Taking? Authorizing Provider  benzonatate (TESSALON) 100 MG capsule Take 1 capsule (100 mg total) by mouth every 8 (eight) hours. 01/14/22  Yes Jamal Pavon, Druscilla Brownie, PA-C  predniSONE (DELTASONE) 20 MG tablet Take 1 tablet (20 mg total) by mouth daily for 5 days. 01/14/22 01/19/22 Yes Keigo Whalley, Druscilla Brownie, PA-C  aspirin 81 MG tablet Take 81 mg by mouth daily.    [provider]  atorvastatin (LIPITOR) 80 MG tablet Take 80 mg by mouth daily.    [provider]  buPROPion (WELLBUTRIN XL) 150 MG 24 hr tablet Take 300 mg by mouth daily. 11/23/20   [provider]  buPROPion (WELLBUTRIN XL) 300 MG 24 hr tablet Take 300 mg by mouth daily.  10/04/13   [provider]  chlorhexidine (PERIDEX) 0.12 % solution RINSE MOUTH WITH 15ML (1 CAPFUL) FOR 30 SECONDS am AND PM AFTER TOOTHBRUSHING. EXPECTORATE AFTER RINSING, DO NOT SWALLOW 03/14/17   [provider]  citalopram (CELEXA) 20 MG tablet Take 1 tablet (20 mg total) by mouth daily. 04/05/17   Dessa Phi, DO  esomeprazole (NEXIUM) 40 MG capsule Take 40 mg by mouth daily at 12 noon.    [provider]  fluorouracil (EFUDEX) 5 % cream APPLY TO AFFECTED AREA OF SKIN 2  TIMES DAILY AS DIRECTED FOR 2 WEEKS 03/27/17   [provider]  Glucosamine-Chondroitin 1500-1200 MG/30ML LIQD Take by mouth.    [provider]  GLUCOSAMINE-CHONDROITIN PO Take 1 tablet by mouth daily.    [provider]  irbesartan (AVAPRO) 150 MG tablet Take 150 mg by mouth daily. 11/23/20   [provider]  irbesartan (AVAPRO) 150 MG tablet 1 tablet 08/31/20   [provider]  ketoconazole (NIZORAL) 2 % cream APPLY TO AFFECTED AREA 2 TIMES DAILY FOR 2 WEEKS 03/27/17   [provider]  lisinopril (ZESTRIL) 10 MG tablet  08/14/19   [provider]  methylphenidate (METADATE CD) 40 MG CR capsule Take 40 mg by mouth every morning. 03/10/17   [provider]  metoprolol succinate (TOPROL-XL) 50 MG 24 hr tablet Take 50 mg by mouth daily. Take with or immediately following a meal.    [provider]  moxifloxacin (VIGAMOX) 0.5 % ophthalmic solution Apply to eye. 12/27/20   [provider]  Naftifine HCl (NAFTIN) 2 % CREA Apply 1 application topically 1 day or 1 dose. 11/30/14   Trula Slade, DPM  NITROSTAT 0.4 MG SL tablet Place 0.4 mg under the tongue every 5 (five) minutes as needed for chest pain.  09/23/13   [provider]  Omega-3 Fatty Acids (FISH OIL) 1000 MG CAPS Take 1,000 mg  by mouth daily.    [provider]  prednisoLONE acetate (PRED FORTE) 1 % ophthalmic suspension SMARTSIG:In Eye(s) 12/27/20   [provider]  PREVIDENT 5000 SENSITIVE 1.1-5 % PSTE BRUSH ON teeth 2 TIMES DAILY FOR 2 minutes. spit out as much possible. DO not SWISH, eat, OR drink FOR 30 minutes 01/01/17   [provider]  PROLENSA 0.07 % SOLN Apply to eye. 12/29/20   [provider]      Allergies    Patient has no known allergies.    Review of Systems   Review of Systems  Constitutional:  Negative for chills and fever.  Respiratory:  Positive for cough. Negative for shortness of breath.    Cardiovascular:  Negative for chest pain.  All other systems reviewed and are negative.  Physical Exam Updated Vital Signs BP (!) 144/85 (BP Location: Right Arm)    Pulse 62    Temp 97.9 F (36.6 C)    Resp 18    Ht 5\' 11"  (1.803 m)    Wt 102.1 kg    SpO2 92%    BMI 31.38 kg/m  Physical Exam Vitals and nursing note reviewed.  Constitutional:      General: He is not in acute distress.    Appearance: He is not ill-appearing.  HENT:     Head: Normocephalic.  Eyes:     Pupils: Pupils are equal, round, and reactive to light.  Cardiovascular:     Rate and Rhythm: Normal rate and regular rhythm.     Pulses: Normal pulses.     Heart sounds: Normal heart sounds. No murmur heard.   No friction rub. No gallop.  Pulmonary:     Effort: Pulmonary effort is normal.     Breath sounds: Normal breath sounds.     Comments: Respirations equal and unlabored, patient able to speak in full sentences, lungs clear to auscultation bilaterally Abdominal:     General: Abdomen is flat. There is no distension.     Palpations: Abdomen is soft.     Tenderness: There is no abdominal tenderness. There is no guarding or rebound.  Musculoskeletal:        General: Normal range of motion.     Cervical back: Neck supple.  Skin:    General: Skin is warm and dry.  Neurological:     General: No focal deficit present.     Mental Status: He is alert.  Psychiatric:        Mood and Affect: Mood normal.        Behavior: Behavior normal.    ED Results / Procedures / Treatments   Labs (all labs ordered are listed, but only abnormal results are displayed) Labs Reviewed  RESP PANEL BY RT-PCR (FLU A&B, COVID) [id number]    EKG None  Radiology DG Chest Portable 1 View  Result Date: 01/14/2022 CLINICAL DATA:  Cough, congestion. EXAM: PORTABLE CHEST 1 VIEW COMPARISON:  04/08/2017 and CT chest 04/03/2017. FINDINGS: Trachea is midline. Heart size stable. Platelike opacity in the left infrahilar region. Right lung is  clear. Left hemidiaphragm is chronically elevated. IMPRESSION: Platelike density in the left infrahilar region is likely due to atelectasis. Electronically Signed   By: Lorin Picket M.D.   On: 01/14/2022 14:24    Procedures Procedures    Medications Ordered in ED Medications  benzonatate (TESSALON) capsule 100 mg (100 mg Oral Given 01/14/22 1417)    ED Course/ Medical Decision Making/ A&P  Medical Decision Making Amount and/or Complexity of Data Reviewed Radiology: ordered and independent interpretation performed. Decision-making details documented in ED Course.  Risk OTC drugs. Prescription drug management.  71 year old male presents to the ED due to productive cough x1 month.  No fever or chills.  Denies sick contacts or known COVID exposures.  No chest pain or shortness of breath.  Upon arrival, stable vitals.  Patient is afebrile, not tachycardic or hypoxic.  Patient no acute distress.  Benign physical exam.  Lungs clear to auscultation bilaterally.  No stridor or wheeze.  COVID/influenza test ordered at triage which are negative.  Added chest x-ray to rule out pneumonia given productive cough x1 month.  Tessalon given for symptomatic relief.  This patient presents to the ED for concern of cough this involves an extensive number of treatment options, and is a complaint that carries with it a high risk of complications and morbidity.  The differential diagnosis includes COVID, other viral etiology, pneumonia, malignancy  COVID/influenza negative. CXR personally reviewed and interpreted which demonstrates possible atelectasis.  No obvious pneumonia. Hospitalization was considered; however felt patient was stable for discharge given no signs of pneumonia, sepsis, or respiratory distress.  Patient requesting prednisone.  No history of diabetes.  Patient discharged with prednisone and Tessalon. Strict ED precautions discussed with patient. Patient states  understanding and agrees to plan. Patient discharged home in no acute distress and stable vitals  Discussed with Dr. Pearline Cables who agrees with assessment and plan.         Final Clinical Impression(s) / ED Diagnoses Final diagnoses:  Acute cough    Rx / DC Orders ED Discharge Orders          Ordered    benzonatate (TESSALON) 100 MG capsule  Every 8 hours        01/14/22 1521    predniSONE (DELTASONE) 20 MG tablet  Daily        01/14/22 1521              Suzy Bouchard, PA-C 01/14/22 [place], [place], DO 01/15/22 [phone]

## 2022-01-14 NOTE — ED Triage Notes (Signed)
Pt presents via pov from home with chest cough and congestion that has been persistent since early December. Pt has dayquil and nyquil, but the nighttime coughing has become problematic. He wants to see about getting some prednisone or something else ot "knock it out." Pt alert & oriented, nad noted.

## 2022-01-14 NOTE — ED Notes (Signed)
Patient placed in gown in room, alert and ambulatory to room with no issue. Explained to patient we are awaiting results of his swab. Patient in NAD at this time.

## 2022-01-14 NOTE — Discharge Instructions (Addendum)
It was a pleasure taking care of you today. As discussed, your COVID/flu test were negative. Your chest x-ray did not show any pneumonia. I am sending you home with steroids and cough medication. Take as prescribed. Follow-up with PCP if symptoms do not improve over the next week. Return to the ER for new or worsening symptoms.

## 2022-01-18 DIAGNOSIS — J40 Bronchitis, not specified as acute or chronic: Secondary | ICD-10-CM | POA: Diagnosis not present

## 2022-02-07 DIAGNOSIS — I10 Essential (primary) hypertension: Secondary | ICD-10-CM | POA: Diagnosis not present

## 2022-02-07 DIAGNOSIS — K219 Gastro-esophageal reflux disease without esophagitis: Secondary | ICD-10-CM | POA: Diagnosis not present

## 2022-02-07 DIAGNOSIS — J40 Bronchitis, not specified as acute or chronic: Secondary | ICD-10-CM | POA: Diagnosis not present

## 2022-02-07 DIAGNOSIS — Z0000 Encounter for general adult medical examination without abnormal findings: Secondary | ICD-10-CM | POA: Diagnosis not present

## 2022-02-07 DIAGNOSIS — E7800 Pure hypercholesterolemia, unspecified: Secondary | ICD-10-CM | POA: Diagnosis not present

## 2022-02-07 DIAGNOSIS — R7303 Prediabetes: Secondary | ICD-10-CM | POA: Diagnosis not present

## 2022-02-07 DIAGNOSIS — F324 Major depressive disorder, single episode, in partial remission: Secondary | ICD-10-CM | POA: Diagnosis not present

## 2022-03-13 DIAGNOSIS — D1801 Hemangioma of skin and subcutaneous tissue: Secondary | ICD-10-CM | POA: Diagnosis not present

## 2022-03-13 DIAGNOSIS — Z85828 Personal history of other malignant neoplasm of skin: Secondary | ICD-10-CM | POA: Diagnosis not present

## 2022-03-13 DIAGNOSIS — L918 Other hypertrophic disorders of the skin: Secondary | ICD-10-CM | POA: Diagnosis not present

## 2022-03-13 DIAGNOSIS — L570 Actinic keratosis: Secondary | ICD-10-CM | POA: Diagnosis not present

## 2022-03-13 DIAGNOSIS — D044 Carcinoma in situ of skin of scalp and neck: Secondary | ICD-10-CM | POA: Diagnosis not present

## 2022-03-13 DIAGNOSIS — D225 Melanocytic nevi of trunk: Secondary | ICD-10-CM | POA: Diagnosis not present

## 2022-03-13 DIAGNOSIS — L821 Other seborrheic keratosis: Secondary | ICD-10-CM | POA: Diagnosis not present

## 2022-03-13 DIAGNOSIS — D485 Neoplasm of uncertain behavior of skin: Secondary | ICD-10-CM | POA: Diagnosis not present

## 2022-03-21 DIAGNOSIS — Z85828 Personal history of other malignant neoplasm of skin: Secondary | ICD-10-CM | POA: Diagnosis not present

## 2022-03-21 DIAGNOSIS — D044 Carcinoma in situ of skin of scalp and neck: Secondary | ICD-10-CM | POA: Diagnosis not present

## 2022-03-21 DIAGNOSIS — L570 Actinic keratosis: Secondary | ICD-10-CM | POA: Diagnosis not present

## 2022-03-29 DIAGNOSIS — R519 Headache, unspecified: Secondary | ICD-10-CM | POA: Diagnosis not present

## 2022-03-29 DIAGNOSIS — L03811 Cellulitis of head [any part, except face]: Secondary | ICD-10-CM | POA: Diagnosis not present

## 2022-05-30 DIAGNOSIS — R1031 Right lower quadrant pain: Secondary | ICD-10-CM | POA: Diagnosis not present

## 2022-06-04 ENCOUNTER — Other Ambulatory Visit: Payer: Self-pay | Admitting: Surgery

## 2022-06-04 DIAGNOSIS — R1031 Right lower quadrant pain: Secondary | ICD-10-CM

## 2022-08-19 DIAGNOSIS — R0602 Shortness of breath: Secondary | ICD-10-CM | POA: Diagnosis not present

## 2022-08-19 DIAGNOSIS — R7303 Prediabetes: Secondary | ICD-10-CM | POA: Diagnosis not present

## 2022-08-19 DIAGNOSIS — R0989 Other specified symptoms and signs involving the circulatory and respiratory systems: Secondary | ICD-10-CM | POA: Diagnosis not present

## 2022-08-19 DIAGNOSIS — K219 Gastro-esophageal reflux disease without esophagitis: Secondary | ICD-10-CM | POA: Diagnosis not present

## 2022-08-19 DIAGNOSIS — E7800 Pure hypercholesterolemia, unspecified: Secondary | ICD-10-CM | POA: Diagnosis not present

## 2022-08-19 DIAGNOSIS — R972 Elevated prostate specific antigen [PSA]: Secondary | ICD-10-CM | POA: Diagnosis not present

## 2022-08-19 DIAGNOSIS — I10 Essential (primary) hypertension: Secondary | ICD-10-CM | POA: Diagnosis not present

## 2022-08-19 DIAGNOSIS — F324 Major depressive disorder, single episode, in partial remission: Secondary | ICD-10-CM | POA: Diagnosis not present

## 2022-08-21 ENCOUNTER — Other Ambulatory Visit: Payer: Self-pay | Admitting: Family Medicine

## 2022-08-21 DIAGNOSIS — R0989 Other specified symptoms and signs involving the circulatory and respiratory systems: Secondary | ICD-10-CM

## 2022-08-22 ENCOUNTER — Other Ambulatory Visit: Payer: Self-pay | Admitting: Family Medicine

## 2022-08-22 DIAGNOSIS — R0989 Other specified symptoms and signs involving the circulatory and respiratory systems: Secondary | ICD-10-CM

## 2022-09-05 ENCOUNTER — Ambulatory Visit
Admission: RE | Admit: 2022-09-05 | Discharge: 2022-09-05 | Disposition: A | Discharge status: Home / Self Care | Payer: Medicare Other | Source: Ambulatory Visit | Attending: Family Medicine | Admitting: Family Medicine

## 2022-09-05 DIAGNOSIS — R0989 Other specified symptoms and signs involving the circulatory and respiratory systems: Secondary | ICD-10-CM | POA: Diagnosis not present

## 2022-09-05 DIAGNOSIS — I998 Other disorder of circulatory system: Secondary | ICD-10-CM | POA: Diagnosis not present

## 2022-09-08 NOTE — Progress Notes (Unsigned)
Cardiology Office Note:   Date:  09/11/2022  NAME:  Kenneth Henderson    MRN: [id number] DOB:  06-Dec-1951   PCP:  Shirline Frees, MD  Cardiologist:  Evalina Field, MD  Electrophysiologist:  None   Referring MD: Shirline Frees, MD   Chief Complaint  Patient presents with   Shortness of Breath    History of Present Illness:   Kenneth Henderson is a 71 y.o. male with a hx of CAD, HTN, HLD who is being seen today for the evaluation of SOB at the request of Shirline Frees, MD. he reports low energy for several months.  Reports he does not have the energy to do anything.  He is not exercising.  He reports it takes him a lot to get out of bed in the morning.  He watches TV all day and then goes back to bed.  He reports he may be depressed.  He is working with his primary care physician on this.  He does have a history of CAD.  Had a PCI to the RCA in 2015.  Left heart catheterization in 2018 with mild to moderate mid LAD disease.  We discussed stress testing and echocardiogram.  His EKG is normal.  His CV exam is normal.  He is reluctant to do a stress test.  Apparently his CAD was missed in the past.  We discussed cardiac PET.  He is interested in this.  This will directly measure the myocardial blood flow.  He reports no chest pain.  No chest pressure.  It appears to be low energy and shortness of breath.  He is working with his primary care physician on medical reasons.  No other updates to his medical history.  His blood pressure is well controlled.  He reports it may not be controlled at home.  We discussed that he could have sleep apnea.  Apparently he does not snore.  He does not sleep well but he believes this is due to possible depression.  Problem List CAD -inferior MI with PCI to RCA 2015 -EF 50-55% -LHC 2018:  50% mid LAD, 75% D1 2. HLD -Total cholesterol 169, HDL 38, LDL 92, triglycerides 232 3. HTN  Past Medical History: Past Medical History:  Diagnosis Date   Actinic keratosis  treated with topical fluorouracil (5FU)    Anxiety    Arthritis    "mostly in my shoulders; spine" (04/03/2017)   Attention deficit disorder (ADD) in adult    Basal cell carcinoma    Coronary artery disease     CAD with cath 06/2014 in setting of inferior MI showing occluded RCA and underwent PCI with DES to the RCA.  EF at that time was 40-45% and echo 9/15 showed EF 60-65% with normal RV in [facility]   Depression    GERD (gastroesophageal reflux disease)    Hyperlipidemia LDL goal <70    Hypertension    Inferior MI ([place]) 06/2014   Archie Endo 04/03/2017   Melanoma of forearm, left ([place])    Sleep apnea    "never took test or wore mask" (04/03/2017)   Squamous carcinoma     Past Surgical History: Past Surgical History:  Procedure Laterality Date   BACK SURGERY     CORONARY ANGIOPLASTY WITH STENT PLACEMENT  06/2014   with 100 % occlusion to the RCA, S/P PCI with DES/notes 04/03/2017   DENTAL SURGERY  "several"   INGUINAL HERNIA REPAIR Bilateral ~ 2013   LEFT HEART CATH AND CORONARY ANGIOGRAPHY N/A 04/04/2017  Procedure: Left Heart Cath and Coronary Angiography;  Surgeon: Belva Crome, MD;  Location: [facility];  Service: Cardiovascular;  Laterality: N/A;   LUMBAR DISC SURGERY  1980s   "ruptured disc"   LUMBAR LAMINECTOMY  12/2015   L1-3/notes 01/17/2016   MELANOMA EXCISION Left    "forehead"   MOHS SURGERY  X3   "some basal; some squamous"   NASAL SINUS SURGERY  1980s?   PROSTATE BIOPSY      Current Medications: Current Meds  Medication Sig   aspirin 81 MG tablet Take 81 mg by mouth daily.   atorvastatin (LIPITOR) 80 MG tablet Take 80 mg by mouth daily.   benzonatate (TESSALON) 100 MG capsule Take 1 capsule (100 mg total) by mouth every 8 (eight) hours.   buPROPion (WELLBUTRIN XL) 150 MG 24 hr tablet Take 300 mg by mouth daily.   buPROPion (WELLBUTRIN XL) 300 MG 24 hr tablet Take 300 mg by mouth daily.    chlorhexidine (PERIDEX) 0.12 % solution RINSE MOUTH WITH 15ML (1  CAPFUL) FOR 30 SECONDS am AND PM AFTER TOOTHBRUSHING. EXPECTORATE AFTER RINSING, DO NOT SWALLOW   citalopram (CELEXA) 20 MG tablet Take 1 tablet (20 mg total) by mouth daily.   esomeprazole (NEXIUM) 40 MG capsule Take 40 mg by mouth daily at 12 noon.   fluorouracil (EFUDEX) 5 % cream APPLY TO AFFECTED AREA OF SKIN 2 TIMES DAILY AS DIRECTED FOR 2 WEEKS   Glucosamine-Chondroitin 1500-1200 MG/30ML LIQD Take by mouth.   GLUCOSAMINE-CHONDROITIN PO Take 1 tablet by mouth daily.   irbesartan (AVAPRO) 150 MG tablet Take 150 mg by mouth daily.   ketoconazole (NIZORAL) 2 % cream APPLY TO AFFECTED AREA 2 TIMES DAILY FOR 2 WEEKS   lisinopril (ZESTRIL) 10 MG tablet    methylphenidate (METADATE CD) 40 MG CR capsule Take 40 mg by mouth every morning.   metoprolol succinate (TOPROL-XL) 50 MG 24 hr tablet Take 50 mg by mouth daily. Take with or immediately following a meal.   moxifloxacin (VIGAMOX) 0.5 % ophthalmic solution Apply to eye.   Naftifine HCl (NAFTIN) 2 % CREA Apply 1 application topically 1 day or 1 dose.   NITROSTAT 0.4 MG SL tablet Place 0.4 mg under the tongue every 5 (five) minutes as needed for chest pain.    Omega-3 Fatty Acids (FISH OIL) 1000 MG CAPS Take 1,000 mg by mouth daily.   prednisoLONE acetate (PRED FORTE) 1 % ophthalmic suspension SMARTSIG:In Eye(s)   PREVIDENT 5000 SENSITIVE 1.1-5 % PSTE BRUSH ON teeth 2 TIMES DAILY FOR 2 minutes. spit out as much possible. DO not SWISH, eat, OR drink FOR 30 minutes   PROLENSA 0.07 % SOLN Apply to eye.     Allergies:    Patient has no known allergies.   Social History: Social History   Socioeconomic History   Marital status: Married    Spouse name: Not on file   Number of children: 3   Years of education: Not on file   Highest education level: Not on file  Occupational History   Occupation: Conservation officer, historic buildings  Tobacco Use   Smoking status: Former    Packs/day: 1.00    Years: 15.00    Total pack years: 15.00    Types: Cigarettes    Smokeless tobacco: Never   Tobacco comments:    04/03/2017 "quit smoking in the 1980s; chewed some in high school"  Substance and Sexual Activity   Alcohol use: Yes    Comment: once a week  or so   Drug use: No   Sexual activity: Not Currently  Other Topics Concern   Not on file  Social History Narrative   Not on file   Social Determinants of Health   Financial Resource Strain: Not on file  Food Insecurity: Not on file  Transportation Needs: Not on file  Physical Activity: Not on file  Stress: Not on file  Social Connections: Not on file     Family History: The patient's family history includes Alzheimer's disease in his father; CAD in his brother and brother; Cancer in his sister; Healthy in his brother; Heart attack (age of onset: 39) in his brother; Heart failure in his mother; Melanoma in his brother.  ROS:   All other ROS reviewed and negative. Pertinent positives noted in the HPI.     EKGs/Labs/Other Studies Reviewed:   The following studies were personally reviewed by me today:  EKG:  EKG is ordered today.  The ekg ordered today demonstrates sinus bradycardia heart rate 58, no acute ischemic changes or evidence of infarction, and was personally reviewed by me.   Recent Labs: No results found for requested labs within last 365 days.   Recent Lipid Panel    Component Value Date/Time   CHOL 128 09/06/2014 0838   TRIG 97 09/06/2014 0838   HDL 31 (L) 09/06/2014 0838   CHOLHDL 4.1 09/06/2014 0838   VLDL 19 09/06/2014 0838   LDLCALC 78 09/06/2014 0838    Physical Exam:   VS:  BP 126/70   Pulse (!) 58   Ht '5\' 11"'$  (1.803 m)   Wt 225 lb 3.2 oz (102.2 kg)   BMI 31.41 kg/m    Wt Readings from Last 3 Encounters:  09/11/22 225 lb 3.2 oz (102.2 kg)  01/14/22 225 lb (102.1 kg)  08/16/19 240 lb (108.9 kg)    General: Well nourished, well developed, in no acute distress Head: Atraumatic, normal size  Eyes: PEERLA, EOMI  Neck: Supple, no JVD Endocrine: No  thryomegaly Cardiac: Normal S1, S2; RRR; no murmurs, rubs, or gallops Lungs: Clear to auscultation bilaterally, no wheezing, rhonchi or rales  Abd: Soft, nontender, no hepatomegaly  Ext: No edema, pulses 2+ Musculoskeletal: No deformities, BUE and BLE strength normal and equal Skin: Warm and dry, no rashes   Neuro: Alert and oriented to person, place, time, and situation, CNII-XII grossly intact, no focal deficits  Psych: Normal mood and affect   ASSESSMENT:   Yer Olivencia is a 71 y.o. male who presents for the following: 1. SOB (shortness of breath) on exertion   2. Coronary artery disease involving native coronary artery of native heart without angina pectoris   3. Mixed hyperlipidemia   4. Primary hypertension     PLAN:   1. SOB (shortness of breath) on exertion 2. Coronary artery disease involving native coronary artery of native heart without angina pectoris 3. Mixed hyperlipidemia 4. Primary hypertension -New symptoms of low energy and fatigue.  He reports he may be depressed.  He also describes shortness of breath with activity.  Unclear if this is fatigue or low energy related.  His EKG is normal.  He does have a history of CAD.  Status post PCI to the mid RCA in 2015.  He also had mild to moderate disease in the LAD and diagonal branch on heart catheterization in 2018.  We discussed reevaluation with stress testing.  He is reluctant to do SPECT.  We discussed cardiac PET.  He is interested.  We will also obtain an echocardiogram.  I did inform him that cardiac PET directly measures the myocardial blood flow so false positives or false negatives are less likely.  We will start here.  He will continue aspirin.  He is on Lipitor.  His blood pressure is well controlled.  Other causes for his symptoms could be related to his sleep apnea or depression.  He will work with his primary care physician on this.  We also discussed getting his testosterone checked.  I have encouraged him to remain  active as this will also help.  He will see me back in 1 year.  I will notify him of his results over the phone.   Shared Decision Making/Informed Consent The risks [chest pain, shortness of breath, cardiac arrhythmias, dizziness, blood pressure fluctuations, myocardial infarction, stroke/transient ischemic attack, nausea, vomiting, allergic reaction, radiation exposure, metallic taste sensation and life-threatening complications (estimated to be 1 in 10,000)], benefits (risk stratification, diagnosing coronary artery disease, treatment guidance) and alternatives of a cardiac PET stress test were discussed in detail with Mr. Valverde and he agrees to proceed.  Disposition: Return in about 1 year (around 09/12/2023).  Medication Adjustments/Labs and Tests Ordered: Current medicines are reviewed at length with the patient today.  Concerns regarding medicines are outlined above.  Orders Placed This Encounter  Procedures   NM PET CT CARDIAC PERFUSION MULTI W/ABSOLUTE BLOODFLOW   Cardiac Stress Test: Informed Consent Details: Physician/Practitioner Attestation; Transcribe to consent form and obtain patient signature   EKG 12-Lead   ECHOCARDIOGRAM COMPLETE   No orders of the defined types were placed in this encounter.   Patient Instructions  Medication Instructions:  The current medical regimen is effective;  continue present plan and medications.  *If you need a refill on your cardiac medications before your next appointment, please call your pharmacy*   Testing/Procedures: Echocardiogram - Your physician has requested that you have an echocardiogram. Echocardiography is a painless test that uses sound waves to create images of your heart. It provides your doctor with information about the size and shape of your heart and how well your heart's chambers and valves are working. This procedure takes approximately one hour. There are no restrictions for this procedure.   CARDIAC PET- Your  physician has requested that you have a Cardiac Pet Stress Test. This testing is completed at [facility] ([place], [place] [address]). The schedulers will call you to get this scheduled. Please follow instructions below and call the office with any questions/concerns [phone]).    Follow-Up: At [facility], you and your health needs are our priority.  As part of our continuing mission to provide you with exceptional heart care, we have created designated Provider Care Teams.  These Care Teams include your primary Cardiologist (physician) and Advanced Practice Providers (APPs -  Physician Assistants and Nurse Practitioners) who all work together to provide you with the care you need, when you need it.  We recommend signing up for the patient portal called "MyChart".  Sign up information is provided on this After Visit Summary.  MyChart is used to connect with patients for Virtual Visits (Telemedicine).  Patients are able to view lab/test results, encounter notes, upcoming appointments, etc.  Non-urgent messages can be sent to your provider as well.   To learn more about what you can do with MyChart, go to [email or web address].    Your next appointment:   12 month(s)  The format for your next appointment:  In Person  Provider:   Evalina Field, MD       How to Prepare for Your Cardiac PET/CT Stress Test:  1. Please do not take these medications before your test:   Medications that may interfere with the cardiac pharmacological stress agent (ex. nitrates - including erectile dysfunction medications or beta-blockers) the day of the exam. (Erectile dysfunction medication should be held for at least 72 hrs prior to test) Theophylline containing medications for 12 hours. Dipyridamole 48 hours prior to the test. Your remaining medications may be taken with water.  2. Nothing to eat or drink, except water, 3 hours prior to arrival time.    NO caffeine/decaffeinated products, or chocolate 12 hours prior to arrival.  3. NO perfume, cologne or lotion  4. Total time is 1 to 2 hours; you may want to bring reading material for the waiting time.  5. Please report to Admitting at the [facility] Main Entrance 60 minutes early for your test.  [place], [place] [address]  Diabetic Preparation:  Hold oral medications. You may take NPH and Lantus insulin. Do not take Humalog or Humulin R (Regular Insulin) the day of your test. Check blood sugars prior to leaving the house. If able to eat breakfast prior to 3 hour fasting, you may take all medications, including your insulin, Do not worry if you miss your breakfast dose of insulin - start at your next meal.  IF YOU THINK YOU MAY BE PREGNANT, OR ARE NURSING PLEASE INFORM THE TECHNOLOGIST.  In preparation for your appointment, medication and supplies will be purchased.  Appointment availability is limited, so if you need to cancel or reschedule, please call the Radiology Department at [phone]  24 hours in advance to avoid a cancellation fee of $100.00  What to Expect After you Arrive:  Once you arrive and check in for your appointment, you will be taken to a preparation room within the Radiology Department.  A technologist or Nurse will obtain your medical history, verify that you are correctly prepped for the exam, and explain the procedure.  Afterwards,  an IV will be started in your arm and electrodes will be placed on your skin for EKG monitoring during the stress portion of the exam. Then you will be escorted to the PET/CT scanner.  There, staff will get you positioned on the scanner and obtain a blood pressure and EKG.  During the exam, you will continue to be connected to the EKG and blood pressure machines.  A small, safe amount of a radioactive tracer will be injected in your IV to obtain a series of pictures of your heart along with an injection of  a stress agent.    After your Exam:  It is recommended that you eat a meal and drink a caffeinated beverage to counter act any effects of the stress agent.  Drink plenty of fluids for the remainder of the day and urinate frequently for the first couple of hours after the exam.  Your doctor will inform you of your test results within 7-10 business days.  For questions about your test or how to prepare for your test, please call: Marchia Bond, Cardiac Imaging Nurse Navigator  Gordy Clement, Cardiac Imaging Nurse Navigator Office: [phone]      Signed, Addison Naegeli. Audie Box, MD, [place]  [address], [place], [place] [address] [phone]  09/11/2022 9:53 AM

## 2022-09-11 ENCOUNTER — Encounter: Payer: Self-pay | Admitting: Cardiovascular Disease

## 2022-09-11 ENCOUNTER — Ambulatory Visit: Payer: Medicare Other | Attending: Cardiovascular Disease | Admitting: Cardiovascular Disease

## 2022-09-11 VITALS — BP 126/70 | HR 58 | Ht 71.0 in | Wt 225.2 lb

## 2022-09-11 DIAGNOSIS — E782 Mixed hyperlipidemia: Secondary | ICD-10-CM | POA: Insufficient documentation

## 2022-09-11 DIAGNOSIS — R0602 Shortness of breath: Secondary | ICD-10-CM | POA: Insufficient documentation

## 2022-09-11 DIAGNOSIS — I2510 Atherosclerotic heart disease of native coronary artery without angina pectoris: Secondary | ICD-10-CM | POA: Insufficient documentation

## 2022-09-11 DIAGNOSIS — I10 Essential (primary) hypertension: Secondary | ICD-10-CM | POA: Diagnosis not present

## 2022-09-11 NOTE — Patient Instructions (Addendum)
Medication Instructions:  The current medical regimen is effective;  continue present plan and medications.  *If you need a refill on your cardiac medications before your next appointment, please call your pharmacy*   Testing/Procedures: Echocardiogram - Your physician has requested that you have an echocardiogram. Echocardiography is a painless test that uses sound waves to create images of your heart. It provides your doctor with information about the size and shape of your heart and how well your heart's chambers and valves are working. This procedure takes approximately one hour. There are no restrictions for this procedure.   CARDIAC PET- Your physician has requested that you have a Cardiac Pet Stress Test. This testing is completed at [facility] ([place], [place] [address]). The schedulers will call you to get this scheduled. Please follow instructions below and call the office with any questions/concerns [phone]).    Follow-Up: At [facility], you and your health needs are our priority.  As part of our continuing mission to provide you with exceptional heart care, we have created designated Provider Care Teams.  These Care Teams include your primary Cardiologist (physician) and Advanced Practice Providers (APPs -  Physician Assistants and Nurse Practitioners) who all work together to provide you with the care you need, when you need it.  We recommend signing up for the patient portal called "MyChart".  Sign up information is provided on this After Visit Summary.  MyChart is used to connect with patients for Virtual Visits (Telemedicine).  Patients are able to view lab/test results, encounter notes, upcoming appointments, etc.  Non-urgent messages can be sent to your provider as well.   To learn more about what you can do with MyChart, go to [email or web address].    Your next appointment:   12 month(s)  The format for your next  appointment:   In Person  Provider:   Evalina Field, MD       How to Prepare for Your Cardiac PET/CT Stress Test:  1. Please do not take these medications before your test:   Medications that may interfere with the cardiac pharmacological stress agent (ex. nitrates - including erectile dysfunction medications or beta-blockers) the day of the exam. (Erectile dysfunction medication should be held for at least 72 hrs prior to test) Theophylline containing medications for 12 hours. Dipyridamole 48 hours prior to the test. Your remaining medications may be taken with water.  2. Nothing to eat or drink, except water, 3 hours prior to arrival time.   NO caffeine/decaffeinated products, or chocolate 12 hours prior to arrival.  3. NO perfume, cologne or lotion  4. Total time is 1 to 2 hours; you may want to bring reading material for the waiting time.  5. Please report to Admitting at the [facility] Main Entrance 60 minutes early for your test.  [place], [place] [address]  Diabetic Preparation:  Hold oral medications. You may take NPH and Lantus insulin. Do not take Humalog or Humulin R (Regular Insulin) the day of your test. Check blood sugars prior to leaving the house. If able to eat breakfast prior to 3 hour fasting, you may take all medications, including your insulin, Do not worry if you miss your breakfast dose of insulin - start at your next meal.  IF YOU THINK YOU MAY BE PREGNANT, OR ARE NURSING PLEASE INFORM THE TECHNOLOGIST.  In preparation for your appointment, medication and supplies will be purchased.  Appointment availability is  limited, so if you need to cancel or reschedule, please call the Radiology Department at [phone]  24 hours in advance to avoid a cancellation fee of $100.00  What to Expect After you Arrive:  Once you arrive and check in for your appointment, you will be taken to a preparation room within the Radiology  Department.  A technologist or Nurse will obtain your medical history, verify that you are correctly prepped for the exam, and explain the procedure.  Afterwards,  an IV will be started in your arm and electrodes will be placed on your skin for EKG monitoring during the stress portion of the exam. Then you will be escorted to the PET/CT scanner.  There, staff will get you positioned on the scanner and obtain a blood pressure and EKG.  During the exam, you will continue to be connected to the EKG and blood pressure machines.  A small, safe amount of a radioactive tracer will be injected in your IV to obtain a series of pictures of your heart along with an injection of a stress agent.    After your Exam:  It is recommended that you eat a meal and drink a caffeinated beverage to counter act any effects of the stress agent.  Drink plenty of fluids for the remainder of the day and urinate frequently for the first couple of hours after the exam.  Your doctor will inform you of your test results within 7-10 business days.  For questions about your test or how to prepare for your test, please call: Marchia Bond, Cardiac Imaging Nurse Navigator  Gordy Clement, Cardiac Imaging Nurse Navigator Office: [phone]

## 2022-09-23 DIAGNOSIS — Z85828 Personal history of other malignant neoplasm of skin: Secondary | ICD-10-CM | POA: Diagnosis not present

## 2022-09-23 DIAGNOSIS — L570 Actinic keratosis: Secondary | ICD-10-CM | POA: Diagnosis not present

## 2022-09-23 DIAGNOSIS — L814 Other melanin hyperpigmentation: Secondary | ICD-10-CM | POA: Diagnosis not present

## 2022-09-23 DIAGNOSIS — Z85820 Personal history of malignant melanoma of skin: Secondary | ICD-10-CM | POA: Diagnosis not present

## 2022-09-23 DIAGNOSIS — C44329 Squamous cell carcinoma of skin of other parts of face: Secondary | ICD-10-CM | POA: Diagnosis not present

## 2022-09-23 DIAGNOSIS — D225 Melanocytic nevi of trunk: Secondary | ICD-10-CM | POA: Diagnosis not present

## 2022-09-23 DIAGNOSIS — L821 Other seborrheic keratosis: Secondary | ICD-10-CM | POA: Diagnosis not present

## 2022-09-23 DIAGNOSIS — L578 Other skin changes due to chronic exposure to nonionizing radiation: Secondary | ICD-10-CM | POA: Diagnosis not present

## 2022-10-01 ENCOUNTER — Ambulatory Visit (INDEPENDENT_AMBULATORY_CARE_PROVIDER_SITE_OTHER): Payer: Medicare Other

## 2022-10-01 DIAGNOSIS — R0602 Shortness of breath: Secondary | ICD-10-CM | POA: Diagnosis not present

## 2022-10-01 LAB — ECHOCARDIOGRAM COMPLETE
Area-P 1/2: 2.99 cm2
S' Lateral: 3.43 cm

## 2022-11-01 ENCOUNTER — Telehealth (HOSPITAL_COMMUNITY): Payer: Self-pay | Admitting: Emergency Medicine

## 2022-11-01 ENCOUNTER — Telehealth (HOSPITAL_COMMUNITY): Payer: Self-pay | Admitting: *Deleted

## 2022-11-01 NOTE — Telephone Encounter (Signed)
Patient returning call regarding upcoming cardiac imaging study; pt verbalizes understanding of appt date/time, parking situation and where to check in, pre-test NPO status, and verified current allergies; name and call back number provided for further questions should they arise  Gordy Clement RN Navigator Cardiac Imaging [place] [facility] [phone] office [phone] cell  Patient aware to avoid caffeine 12 hours prior to his cardiac PET scan and to arrive a 6:30am.

## 2022-11-01 NOTE — Telephone Encounter (Signed)
Attempted to call patient regarding upcoming cardiac PET appointment. Left message on voicemail with name and callback number Xoe Hoe RN Navigator Cardiac Imaging [facility] [phone] Office [phone] Cell  

## 2022-11-05 ENCOUNTER — Encounter (HOSPITAL_COMMUNITY)
Admission: RE | Admit: 2022-11-05 | Discharge: 2022-11-05 | Disposition: A | Discharge status: Home / Self Care | Payer: Medicare Other | Source: Ambulatory Visit | Attending: Cardiovascular Disease | Admitting: Cardiovascular Disease

## 2022-11-05 ENCOUNTER — Encounter (HOSPITAL_COMMUNITY): Payer: Self-pay

## 2022-11-05 ENCOUNTER — Other Ambulatory Visit (HOSPITAL_COMMUNITY): Payer: Medicare Other

## 2022-11-05 DIAGNOSIS — R0602 Shortness of breath: Secondary | ICD-10-CM

## 2022-11-05 LAB — NM PET CT CARDIAC PERFUSION MULTI W/ABSOLUTE BLOODFLOW
MBFR: 2.21
Nuc Rest EF: 49 %
Nuc Stress EF: 52 %
Rest MBF: 0.61 ml/g/min
ST Depression (mm): 0 mm
Stress MBF: 1.35 ml/g/min

## 2022-11-05 MED ORDER — RUBIDIUM RB82 GENERATOR (RUBYFILL)
25.0000 | PACK | Freq: Once | INTRAVENOUS | Status: AC
  Administered 2022-11-05: 26.4 via INTRAVENOUS

## 2022-11-05 MED ORDER — REGADENOSON 0.4 MG/5ML IV SOLN
INTRAVENOUS | Status: AC
  Administered 2022-11-05: 0.4 mg via INTRAVENOUS
  Filled 2022-11-05: qty 5

## 2022-11-05 MED ORDER — REGADENOSON 0.4 MG/5ML IV SOLN: 0.4000 mg | Freq: Once | INTRAVENOUS | Status: AC

## 2022-11-05 NOTE — Progress Notes (Signed)
Pt tolerated exam without incident; pt denies lightheadedness or dizziness; pt ambulatory to lobby steady gait noted  

## 2022-11-05 NOTE — Progress Notes (Signed)
Pt arrived ambulatory to PET scan after following all instructions No caffeine No food No am meds

## 2022-11-25 ENCOUNTER — Telehealth: Payer: Self-pay | Admitting: Cardiovascular Disease

## 2022-11-25 NOTE — Telephone Encounter (Signed)
Called pt who states that he spoke with Almyra Free earlier regarding his results.

## 2022-11-25 NOTE — Telephone Encounter (Signed)
Patient calling for PET results.

## 2022-12-02 DIAGNOSIS — H524 Presbyopia: Secondary | ICD-10-CM | POA: Diagnosis not present

## 2022-12-02 DIAGNOSIS — Z961 Presence of intraocular lens: Secondary | ICD-10-CM | POA: Diagnosis not present

## 2023-02-19 DIAGNOSIS — F324 Major depressive disorder, single episode, in partial remission: Secondary | ICD-10-CM | POA: Diagnosis not present

## 2023-02-19 DIAGNOSIS — K219 Gastro-esophageal reflux disease without esophagitis: Secondary | ICD-10-CM | POA: Diagnosis not present

## 2023-02-19 DIAGNOSIS — I10 Essential (primary) hypertension: Secondary | ICD-10-CM | POA: Diagnosis not present

## 2023-02-19 DIAGNOSIS — N400 Enlarged prostate without lower urinary tract symptoms: Secondary | ICD-10-CM | POA: Diagnosis not present

## 2023-02-19 DIAGNOSIS — I2510 Atherosclerotic heart disease of native coronary artery without angina pectoris: Secondary | ICD-10-CM | POA: Diagnosis not present

## 2023-02-19 DIAGNOSIS — E7800 Pure hypercholesterolemia, unspecified: Secondary | ICD-10-CM | POA: Diagnosis not present

## 2023-02-19 DIAGNOSIS — R7303 Prediabetes: Secondary | ICD-10-CM | POA: Diagnosis not present

## 2023-03-24 DIAGNOSIS — F324 Major depressive disorder, single episode, in partial remission: Secondary | ICD-10-CM | POA: Diagnosis not present

## 2023-04-17 DIAGNOSIS — L218 Other seborrheic dermatitis: Secondary | ICD-10-CM | POA: Diagnosis not present

## 2023-04-17 DIAGNOSIS — L570 Actinic keratosis: Secondary | ICD-10-CM | POA: Diagnosis not present

## 2023-04-17 DIAGNOSIS — Z85828 Personal history of other malignant neoplasm of skin: Secondary | ICD-10-CM | POA: Diagnosis not present

## 2023-04-17 DIAGNOSIS — C44319 Basal cell carcinoma of skin of other parts of face: Secondary | ICD-10-CM | POA: Diagnosis not present

## 2023-04-17 DIAGNOSIS — L820 Inflamed seborrheic keratosis: Secondary | ICD-10-CM | POA: Diagnosis not present

## 2023-06-02 DIAGNOSIS — Z85828 Personal history of other malignant neoplasm of skin: Secondary | ICD-10-CM | POA: Diagnosis not present

## 2023-06-02 DIAGNOSIS — C44319 Basal cell carcinoma of skin of other parts of face: Secondary | ICD-10-CM | POA: Diagnosis not present

## 2023-08-04 ENCOUNTER — Encounter: Payer: Self-pay | Admitting: Podiatry

## 2023-08-04 ENCOUNTER — Ambulatory Visit (INDEPENDENT_AMBULATORY_CARE_PROVIDER_SITE_OTHER): Payer: Medicare Other | Admitting: Podiatry

## 2023-08-04 DIAGNOSIS — L600 Ingrowing nail: Secondary | ICD-10-CM

## 2023-08-04 NOTE — Patient Instructions (Signed)

## 2023-08-05 NOTE — Progress Notes (Signed)
Subjective:   Patient ID: Kenneth Henderson, male   DOB: 72 y.o.   MRN: [id number]   HPI Patient presents with ingrown toenail left big toe that is been very tender and has tried to trim and soak it without relief   ROS      Objective:  Physical Exam  Neurovascular status intact ingrown toenail deformity left hallux lateral border painful when pressed no active drainage redness noted      Assessment:  Chronic ingrown toenail left big toe with pain     Plan:  H&P discussed recommended correction explained procedure risk patient wants surgery and at this point I went ahead and I allowed him to read then signed consent form.  Infiltrated the left big toe 60 mg like Marcaine mixture sterile prep done using sterile instrumentation remove the lateral border exposed matrix applied phenol 3 applications 30 seconds followed by alcohol sterile dressing gave instructions on soaks wear dressing 24 hours take it off earlier if throbbing were to occur and encouraged to come in if any issues should occur or call

## 2023-10-03 DIAGNOSIS — D045 Carcinoma in situ of skin of trunk: Secondary | ICD-10-CM | POA: Diagnosis not present

## 2023-10-03 DIAGNOSIS — Z85828 Personal history of other malignant neoplasm of skin: Secondary | ICD-10-CM | POA: Diagnosis not present

## 2023-10-13 DIAGNOSIS — Z202 Contact with and (suspected) exposure to infections with a predominantly sexual mode of transmission: Secondary | ICD-10-CM | POA: Diagnosis not present

## 2023-10-13 DIAGNOSIS — I10 Essential (primary) hypertension: Secondary | ICD-10-CM | POA: Diagnosis not present

## 2023-10-13 DIAGNOSIS — R7303 Prediabetes: Secondary | ICD-10-CM | POA: Diagnosis not present

## 2023-10-13 DIAGNOSIS — F324 Major depressive disorder, single episode, in partial remission: Secondary | ICD-10-CM | POA: Diagnosis not present

## 2023-10-13 DIAGNOSIS — N400 Enlarged prostate without lower urinary tract symptoms: Secondary | ICD-10-CM | POA: Diagnosis not present

## 2023-10-13 DIAGNOSIS — R972 Elevated prostate specific antigen [PSA]: Secondary | ICD-10-CM | POA: Diagnosis not present

## 2023-10-13 DIAGNOSIS — Z125 Encounter for screening for malignant neoplasm of prostate: Secondary | ICD-10-CM | POA: Diagnosis not present

## 2023-10-13 DIAGNOSIS — Z0000 Encounter for general adult medical examination without abnormal findings: Secondary | ICD-10-CM | POA: Diagnosis not present

## 2023-10-13 DIAGNOSIS — I2510 Atherosclerotic heart disease of native coronary artery without angina pectoris: Secondary | ICD-10-CM | POA: Diagnosis not present

## 2023-10-13 DIAGNOSIS — E7800 Pure hypercholesterolemia, unspecified: Secondary | ICD-10-CM | POA: Diagnosis not present

## 2023-10-13 DIAGNOSIS — K219 Gastro-esophageal reflux disease without esophagitis: Secondary | ICD-10-CM | POA: Diagnosis not present

## 2023-10-14 DIAGNOSIS — L814 Other melanin hyperpigmentation: Secondary | ICD-10-CM | POA: Diagnosis not present

## 2023-10-14 DIAGNOSIS — D225 Melanocytic nevi of trunk: Secondary | ICD-10-CM | POA: Diagnosis not present

## 2023-10-14 DIAGNOSIS — Z85820 Personal history of malignant melanoma of skin: Secondary | ICD-10-CM | POA: Diagnosis not present

## 2023-10-14 DIAGNOSIS — L821 Other seborrheic keratosis: Secondary | ICD-10-CM | POA: Diagnosis not present

## 2023-10-14 DIAGNOSIS — Z85828 Personal history of other malignant neoplasm of skin: Secondary | ICD-10-CM | POA: Diagnosis not present

## 2023-10-14 DIAGNOSIS — L578 Other skin changes due to chronic exposure to nonionizing radiation: Secondary | ICD-10-CM | POA: Diagnosis not present

## 2023-10-14 DIAGNOSIS — L738 Other specified follicular disorders: Secondary | ICD-10-CM | POA: Diagnosis not present

## 2023-10-14 DIAGNOSIS — L570 Actinic keratosis: Secondary | ICD-10-CM | POA: Diagnosis not present

## 2023-10-24 DIAGNOSIS — Z961 Presence of intraocular lens: Secondary | ICD-10-CM | POA: Diagnosis not present

## 2023-10-24 DIAGNOSIS — H524 Presbyopia: Secondary | ICD-10-CM | POA: Diagnosis not present

## 2024-01-16 DIAGNOSIS — L570 Actinic keratosis: Secondary | ICD-10-CM | POA: Diagnosis not present

## 2024-01-16 DIAGNOSIS — L853 Xerosis cutis: Secondary | ICD-10-CM | POA: Diagnosis not present

## 2024-01-16 DIAGNOSIS — Z85828 Personal history of other malignant neoplasm of skin: Secondary | ICD-10-CM | POA: Diagnosis not present

## 2024-01-16 DIAGNOSIS — L738 Other specified follicular disorders: Secondary | ICD-10-CM | POA: Diagnosis not present

## 2024-01-16 DIAGNOSIS — D224 Melanocytic nevi of scalp and neck: Secondary | ICD-10-CM | POA: Diagnosis not present

## 2024-02-27 DIAGNOSIS — M7130 Other bursal cyst, unspecified site: Secondary | ICD-10-CM | POA: Diagnosis not present

## 2024-02-27 DIAGNOSIS — L570 Actinic keratosis: Secondary | ICD-10-CM | POA: Diagnosis not present

## 2024-03-22 DIAGNOSIS — R0789 Other chest pain: Secondary | ICD-10-CM | POA: Diagnosis not present

## 2024-03-22 DIAGNOSIS — R200 Anesthesia of skin: Secondary | ICD-10-CM | POA: Diagnosis not present

## 2024-03-22 DIAGNOSIS — J986 Disorders of diaphragm: Secondary | ICD-10-CM | POA: Diagnosis not present

## 2024-03-22 DIAGNOSIS — J9811 Atelectasis: Secondary | ICD-10-CM | POA: Diagnosis not present

## 2024-03-22 DIAGNOSIS — K3189 Other diseases of stomach and duodenum: Secondary | ICD-10-CM | POA: Diagnosis not present

## 2024-03-22 DIAGNOSIS — R079 Chest pain, unspecified: Secondary | ICD-10-CM | POA: Diagnosis not present

## 2024-03-22 DIAGNOSIS — E7800 Pure hypercholesterolemia, unspecified: Secondary | ICD-10-CM | POA: Diagnosis not present

## 2024-03-22 DIAGNOSIS — I700 Atherosclerosis of aorta: Secondary | ICD-10-CM | POA: Diagnosis not present

## 2024-03-22 DIAGNOSIS — I10 Essential (primary) hypertension: Secondary | ICD-10-CM | POA: Diagnosis not present

## 2024-03-22 DIAGNOSIS — Z955 Presence of coronary angioplasty implant and graft: Secondary | ICD-10-CM | POA: Diagnosis not present

## 2024-03-22 DIAGNOSIS — J8410 Pulmonary fibrosis, unspecified: Secondary | ICD-10-CM | POA: Diagnosis not present

## 2024-03-22 DIAGNOSIS — R0602 Shortness of breath: Secondary | ICD-10-CM | POA: Diagnosis not present

## 2024-03-22 DIAGNOSIS — Z7901 Long term (current) use of anticoagulants: Secondary | ICD-10-CM | POA: Diagnosis not present

## 2024-03-22 DIAGNOSIS — Z743 Need for continuous supervision: Secondary | ICD-10-CM | POA: Diagnosis not present

## 2024-03-22 DIAGNOSIS — R202 Paresthesia of skin: Secondary | ICD-10-CM | POA: Diagnosis not present

## 2024-03-22 DIAGNOSIS — I2510 Atherosclerotic heart disease of native coronary artery without angina pectoris: Secondary | ICD-10-CM | POA: Diagnosis not present

## 2024-03-22 DIAGNOSIS — R42 Dizziness and giddiness: Secondary | ICD-10-CM | POA: Diagnosis not present

## 2024-04-20 DIAGNOSIS — F324 Major depressive disorder, single episode, in partial remission: Secondary | ICD-10-CM | POA: Diagnosis not present

## 2024-04-20 DIAGNOSIS — R7303 Prediabetes: Secondary | ICD-10-CM | POA: Diagnosis not present

## 2024-04-20 DIAGNOSIS — E7800 Pure hypercholesterolemia, unspecified: Secondary | ICD-10-CM | POA: Diagnosis not present

## 2024-04-20 DIAGNOSIS — I10 Essential (primary) hypertension: Secondary | ICD-10-CM | POA: Diagnosis not present

## 2024-07-09 ENCOUNTER — Encounter: Payer: Self-pay | Admitting: Advanced Practice Midwife

## 2024-07-15 DIAGNOSIS — Z85828 Personal history of other malignant neoplasm of skin: Secondary | ICD-10-CM | POA: Diagnosis not present

## 2024-07-15 DIAGNOSIS — D0439 Carcinoma in situ of skin of other parts of face: Secondary | ICD-10-CM | POA: Diagnosis not present

## 2024-07-15 DIAGNOSIS — Z85820 Personal history of malignant melanoma of skin: Secondary | ICD-10-CM | POA: Diagnosis not present

## 2024-07-15 DIAGNOSIS — L720 Epidermal cyst: Secondary | ICD-10-CM | POA: Diagnosis not present

## 2024-07-15 DIAGNOSIS — D485 Neoplasm of uncertain behavior of skin: Secondary | ICD-10-CM | POA: Diagnosis not present

## 2024-07-15 DIAGNOSIS — L570 Actinic keratosis: Secondary | ICD-10-CM | POA: Diagnosis not present

## 2024-10-21 DIAGNOSIS — R55 Syncope and collapse: Secondary | ICD-10-CM | POA: Diagnosis not present

## 2024-10-21 DIAGNOSIS — I10 Essential (primary) hypertension: Secondary | ICD-10-CM | POA: Diagnosis not present

## 2024-10-21 DIAGNOSIS — Z87891 Personal history of nicotine dependence: Secondary | ICD-10-CM | POA: Diagnosis not present

## 2024-10-21 DIAGNOSIS — R7303 Prediabetes: Secondary | ICD-10-CM | POA: Diagnosis not present

## 2024-10-21 DIAGNOSIS — R079 Chest pain, unspecified: Secondary | ICD-10-CM | POA: Diagnosis not present

## 2024-10-21 DIAGNOSIS — I6782 Cerebral ischemia: Secondary | ICD-10-CM | POA: Diagnosis not present

## 2024-10-21 DIAGNOSIS — R9082 White matter disease, unspecified: Secondary | ICD-10-CM | POA: Diagnosis not present

## 2024-10-21 DIAGNOSIS — R9089 Other abnormal findings on diagnostic imaging of central nervous system: Secondary | ICD-10-CM | POA: Diagnosis not present

## 2024-10-21 DIAGNOSIS — E7800 Pure hypercholesterolemia, unspecified: Secondary | ICD-10-CM | POA: Diagnosis not present

## 2024-10-21 DIAGNOSIS — N400 Enlarged prostate without lower urinary tract symptoms: Secondary | ICD-10-CM | POA: Diagnosis not present

## 2024-10-21 DIAGNOSIS — F324 Major depressive disorder, single episode, in partial remission: Secondary | ICD-10-CM | POA: Diagnosis not present

## 2024-10-21 DIAGNOSIS — Z0000 Encounter for general adult medical examination without abnormal findings: Secondary | ICD-10-CM | POA: Diagnosis not present

## 2024-10-21 DIAGNOSIS — I959 Hypotension, unspecified: Secondary | ICD-10-CM | POA: Diagnosis not present

## 2024-10-21 DIAGNOSIS — Z743 Need for continuous supervision: Secondary | ICD-10-CM | POA: Diagnosis not present

## 2024-10-26 DIAGNOSIS — H524 Presbyopia: Secondary | ICD-10-CM | POA: Diagnosis not present

## 2024-11-15 DIAGNOSIS — L218 Other seborrheic dermatitis: Secondary | ICD-10-CM | POA: Diagnosis not present

## 2024-11-15 DIAGNOSIS — L820 Inflamed seborrheic keratosis: Secondary | ICD-10-CM | POA: Diagnosis not present

## 2024-11-15 DIAGNOSIS — Z85828 Personal history of other malignant neoplasm of skin: Secondary | ICD-10-CM | POA: Diagnosis not present

## 2024-11-15 DIAGNOSIS — C4442 Squamous cell carcinoma of skin of scalp and neck: Secondary | ICD-10-CM | POA: Diagnosis not present

## 2024-11-15 DIAGNOSIS — L570 Actinic keratosis: Secondary | ICD-10-CM | POA: Diagnosis not present
